# Patient Record
Sex: Female | Born: 1980 | Hispanic: No | Smoking: Never smoker
Health system: Southern US, Community
[De-identification: ages and names within clinical notes are randomized; demographics above are authoritative.]

## PROBLEM LIST (undated history)

## (undated) DIAGNOSIS — Z87442 Personal history of urinary calculi: Secondary | ICD-10-CM

## (undated) DIAGNOSIS — J302 Other seasonal allergic rhinitis: Secondary | ICD-10-CM

## (undated) DIAGNOSIS — N200 Calculus of kidney: Secondary | ICD-10-CM

## (undated) DIAGNOSIS — N83209 Unspecified ovarian cyst, unspecified side: Secondary | ICD-10-CM

## (undated) DIAGNOSIS — K219 Gastro-esophageal reflux disease without esophagitis: Secondary | ICD-10-CM

## (undated) HISTORY — PX: OVARIAN CYST REMOVAL: SHX89

## (undated) HISTORY — PX: KIDNEY STONE SURGERY: SHX686

## (undated) HISTORY — DX: Unspecified ovarian cyst, unspecified side: N83.209

## (undated) HISTORY — PX: TONSILLECTOMY: SUR1361

## (undated) HISTORY — PX: LASIK: SHX215

## (undated) HISTORY — PX: FOOT SURGERY: SHX648

## (undated) HISTORY — DX: Calculus of kidney: N20.0

---

## 2013-07-14 ENCOUNTER — Encounter: Payer: Self-pay | Admitting: Obstetrics and Gynecology

## 2013-07-14 ENCOUNTER — Ambulatory Visit (INDEPENDENT_AMBULATORY_CARE_PROVIDER_SITE_OTHER): Payer: 59 | Admitting: Obstetrics and Gynecology

## 2013-07-14 VITALS — BP 115/85 | HR 63 | Ht 66.25 in | Wt 150.2 lb

## 2013-07-14 DIAGNOSIS — Z309 Encounter for contraceptive management, unspecified: Secondary | ICD-10-CM

## 2013-07-14 DIAGNOSIS — Z01419 Encounter for gynecological examination (general) (routine) without abnormal findings: Secondary | ICD-10-CM

## 2013-07-14 DIAGNOSIS — Z3049 Encounter for surveillance of other contraceptives: Secondary | ICD-10-CM

## 2013-07-14 LAB — POCT PREGNANCY, URINE: Preg Test, Ur: NEGATIVE

## 2013-07-14 MED ORDER — ETONOGESTREL-ETHINYL ESTRADIOL 0.12-0.015 MG/24HR VA RING: VAGINAL_RING | VAGINAL | Status: DC

## 2013-07-14 NOTE — Progress Notes (Signed)
CC: Gynecologic Exam      HPI Julia Wolfe is a 32 y.o. G0P0000  who presents for GYN exam and Nuva Ring refill. She has been using Nuvaring and is happy with that method. Same-sex partner x2 years. No irritative vaginitis. No dysmenorrhea or dyspareunia. Menses regular. All Paps neg in the past. LMP 8/1//2014. Denies abnormal bleeding. Exercises twice a week and he is healthy diet. Wears seatbelt and uses sunscreen. 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    PMH: kidney stone, ovarian cyst FH: MGM colon cancer; 2 mat. Aunts breast Ca.   Past Surgical History  Procedure Laterality Date  . Tonsillectomy    . Ovarian cyst removal    . Kidney stone surgery    . Foot surgery    . Lasik      History   Social History  . Marital Status: Single    Spouse Name: N/A    Number of Children: N/A  . Years of Education: N/A   Occupational History  . Not on file.   Social History Main Topics  . Smoking status: Never Smoker   . Smokeless tobacco: Never Used  . Alcohol Use: Yes     Comment: rare  . Drug Use: No  . Sexual Activity: Yes     Comment: nuva ring   Other Topics Concern  . Not on file   Social History Narrative  . No narrative on file    No current outpatient prescriptions on file prior to visit.   No current facility-administered medications on file prior to visit.    No Known Allergies  ROS Pertinent items in HPI  PHYSICAL EXAM Filed Vitals:   07/14/13 1523  BP: 115/85  Pulse: 63   General: Well nourished, well developed female in no acute distress Cardiovascular: Normal rate Respiratory: Normal effort Neck: Thyroid NP Breasts: Symmetric, nipples erect without discharge, no discrete masses. No lymphadenopathy Abdomen: Soft, nontender Back: No CVAT Extremities: No edema Neurologic: Alert and oriented Speculum exam: NEFG; vagina with physiologic discharge, no blood; cervix clean Bimanual exam: cervix closed, no CMT; uterus NSSP; no adnexal  tenderness or masses   LAB RESULTS Results for orders placed in visit on 07/14/13 (from the past 24 hour(s))  POCT PREGNANCY, URINE     Status: None   Collection Time    07/14/13  3:19 PM      Result Value Range   Preg Test, Ur NEGATIVE  NEGATIVE    ASSESSMENT  1. Surveillance of other previously prescribed contraceptive method   Normal GYN exam  PLAN Pap with GC Chlamydia done. AVS Preventive health    Medication List       This list is accurate as of: 07/14/13  3:59 PM.  Always use your most recent med list.               etonogestrel-ethinyl estradiol 0.12-0.015 MG/24HR vaginal ring  Commonly known as:  NUVARING  Insert vaginally and leave in place for 3 consecutive weeks, then remove for 1 week.      We'll get ROI of records of previous Pap smears and followup as needed or yearly.    Danae Orleans, CNM 07/14/2013 3:54 PM

## 2013-07-14 NOTE — Addendum Note (Signed)
Addended by: Goldie Tregoning L on: 07/14/2013 04:28 PM   Modules accepted: Orders  

## 2013-07-14 NOTE — Patient Instructions (Addendum)

## 2013-07-14 NOTE — Progress Notes (Signed)
Needs rx for nuva ring for 90 day supply with 1 year of refills sent to mail order pharmacy that is on chart. Also needs a rx for 30 day supply printed for her to take to a pharmacy.

## 2013-10-10 ENCOUNTER — Other Ambulatory Visit: Payer: Self-pay | Admitting: Obstetrics and Gynecology

## 2013-10-29 ENCOUNTER — Other Ambulatory Visit: Payer: Self-pay

## 2013-10-29 DIAGNOSIS — Z3049 Encounter for surveillance of other contraceptives: Secondary | ICD-10-CM

## 2013-10-29 DIAGNOSIS — Z309 Encounter for contraceptive management, unspecified: Secondary | ICD-10-CM

## 2013-10-29 MED ORDER — ETONOGESTREL-ETHINYL ESTRADIOL 0.12-0.015 MG/24HR VA RING: VAGINAL_RING | VAGINAL | Status: DC

## 2013-10-29 NOTE — Telephone Encounter (Signed)
Pt's pharmacy sent via fax a refill on Nuva Ring.  Per [id number], pt can have refill on medication with pt having a normal pap smear resulted from 06/2013

## 2014-06-26 ENCOUNTER — Encounter: Payer: Self-pay | Admitting: Obstetrics and Gynecology

## 2014-06-26 ENCOUNTER — Ambulatory Visit (INDEPENDENT_AMBULATORY_CARE_PROVIDER_SITE_OTHER): Payer: 59 | Admitting: Obstetrics and Gynecology

## 2014-06-26 VITALS — BP 116/76 | HR 60 | Wt 142.0 lb

## 2014-06-26 DIAGNOSIS — Z3401 Encounter for supervision of normal first pregnancy, first trimester: Secondary | ICD-10-CM

## 2014-06-26 DIAGNOSIS — Z3400 Encounter for supervision of normal first pregnancy, unspecified trimester: Secondary | ICD-10-CM

## 2014-06-26 LAB — OB RESULTS CONSOLE RUBELLA ANTIBODY, IGM: Rubella: IMMUNE

## 2014-06-26 MED ORDER — DOXYLAMINE-PYRIDOXINE 10-10 MG PO TBEC: DELAYED_RELEASE_TABLET | ORAL | Status: DC

## 2014-06-26 MED ORDER — COMPLETENATE 29-1 MG PO CHEW: 1.0000 | CHEWABLE_TABLET | Freq: Every day | ORAL | Status: DC

## 2014-06-26 NOTE — Progress Notes (Signed)
   Subjective:    Julia Wolfe is a G1P0000 5237w1d being seen today for her first obstetrical visit.  Her obstetrical history is significant for first pregnancy. Patient does intend to breast feed. Pregnancy history fully reviewed.  Patient reports nausea.  Filed Vitals:   06/26/14 0945  BP: 116/76  Pulse: 60  Weight: 142 lb (64.411 kg)    HISTORY: OB History  Gravida Para Term Preterm AB SAB TAB Ectopic Multiple Living  1 0 0 0 0 0 0 0 0 0     # Outcome Date GA Lbr Len/2nd Weight Sex Delivery Anes PTL Lv  1 CUR              Past Medical History  Diagnosis Date  . Ovarian cyst   . Kidney stone    Past Surgical History  Procedure Laterality Date  . Tonsillectomy    . Ovarian cyst removal    . Kidney stone surgery    . Foot surgery    . Lasik    . Foot surgery Left 2008-2009   Family History  Problem Relation Age of Onset  . Cancer Maternal Aunt     breast  . Cancer Maternal Grandmother     colon  . Cancer Maternal Grandfather     lung  . Cancer Paternal Grandmother     bladder     Exam    Uterus:     Pelvic Exam:    Perineum: No Hemorrhoids, Normal Perineum   Vulva: normal   Vagina:  normal mucosa, normal discharge   pH:    Cervix: closed and long   Adnexa: normal adnexa and no mass, fullness, tenderness   Bony Pelvis: android  System: Breast:  normal appearance, no masses or tenderness   Skin: normal coloration and turgor, no rashes    Neurologic: oriented, no focal deficits   Extremities: normal strength, tone, and muscle mass   HEENT extra ocular movement intact   Mouth/Teeth mucous membranes moist, pharynx normal without lesions and dental hygiene good   Neck supple and no masses   Cardiovascular: regular rate and rhythm   Respiratory:  chest clear, no wheezing, crepitations, rhonchi, normal symmetric air entry   Abdomen: soft, non-tender; bowel sounds normal; no masses,  no organomegaly   Urinary:       Assessment:    Pregnancy:  G1P0000 Patient Active Problem List   Diagnosis Date Noted  . Supervision of normal first pregnancy 06/26/2014  . Contraception management 07/14/2013        Plan:     Initial labs drawn. Prenatal vitamins. Problem list reviewed and updated. Genetic Screening discussed First Screen: requested.  Ultrasound discussed; fetal survey: requested. Office ultrasound demonstrates an intrauterine gestational sac with yolk sac. No embryo visulaized. GS measuring 575w5d  Follow up in 4 weeks. 50% of 30 min visit spent on counseling and coordination of care.     Noal Abshier 06/26/2014

## 2014-06-26 NOTE — Patient Instructions (Signed)
First Trimester of Pregnancy The first trimester of pregnancy is from week 1 until the end of week 12 (months 1 through 3). A week after a sperm fertilizes an egg, the egg will implant on the wall of the uterus. This embryo will begin to develop into a baby. Genes from you and your partner are forming the baby. The female genes determine whether the baby is a boy or a girl. At 6-8 weeks, the eyes and face are formed, and the heartbeat can be seen on ultrasound. At the end of 12 weeks, all the baby's organs are formed.  Now that you are pregnant, you will want to do everything you can to have a healthy baby. Two of the most important things are to get good prenatal care and to follow your health care provider's instructions. Prenatal care is all the medical care you receive before the baby's birth. This care will help prevent, find, and treat any problems during the pregnancy and childbirth. BODY CHANGES Your body goes through many changes during pregnancy. The changes vary from woman to woman.   You may gain or lose a couple of pounds at first.  You may feel sick to your stomach (nauseous) and throw up (vomit). If the vomiting is uncontrollable, call your health care provider.  You may tire easily.  You may develop headaches that can be relieved by medicines approved by your health care provider.  You may urinate more often. Painful urination may mean you have a bladder infection.  You may develop heartburn as a result of your pregnancy.  You may develop constipation because certain hormones are causing the muscles that push waste through your intestines to slow down.  You may develop hemorrhoids or swollen, bulging veins (varicose veins).  Your breasts may begin to grow larger and become tender. Your nipples may stick out more, and the tissue that surrounds them (areola) may become darker.  Your gums may bleed and may be sensitive to brushing and flossing.  Dark spots or blotches  (chloasma, mask of pregnancy) may develop on your face. This will likely fade after the baby is born.  Your menstrual periods will stop.  You may have a loss of appetite.  You may develop cravings for certain kinds of food.  You may have changes in your emotions from day to day, such as being excited to be pregnant or being concerned that something may go wrong with the pregnancy and baby.  You may have more vivid and strange dreams.  You may have changes in your hair. These can include thickening of your hair, rapid growth, and changes in texture. Some women also have hair loss during or after pregnancy, or hair that feels dry or thin. Your hair will most likely return to normal after your baby is born. WHAT TO EXPECT AT YOUR PRENATAL VISITS During a routine prenatal visit:  You will be weighed to make sure you and the baby are growing normally.  Your blood pressure will be taken.  Your abdomen will be measured to track your baby's growth.  The fetal heartbeat will be listened to starting around week 10 or 12 of your pregnancy.  Test results from any previous visits will be discussed. Your health care provider may ask you:  How you are feeling.  If you are feeling the baby move.  If you have had any abnormal symptoms, such as leaking fluid, bleeding, severe headaches, or abdominal cramping.  If you have any questions. Other tests   that may be performed during your first trimester include:  Blood tests to find your blood type and to check for the presence of any previous infections. They will also be used to check for low iron levels (anemia) and Rh antibodies. Later in the pregnancy, blood tests for diabetes will be done along with other tests if problems develop.  Urine tests to check for infections, diabetes, or protein in the urine.  An ultrasound to confirm the proper growth and development of the baby.  An amniocentesis to check for possible genetic problems.  Fetal  screens for spina bifida and Down syndrome.  You may need other tests to make sure you and the baby are doing well. HOME CARE INSTRUCTIONS  Medicines  Follow your health care provider's instructions regarding medicine use. Specific medicines may be either safe or unsafe to take during pregnancy.  Take your prenatal vitamins as directed.  If you develop constipation, try taking a stool softener if your health care provider approves. Diet  Eat regular, well-balanced meals. Choose a variety of foods, such as meat or vegetable-based protein, fish, milk and low-fat dairy products, vegetables, fruits, and whole grain breads and cereals. Your health care provider will help you determine the amount of weight gain that is right for you.  Avoid raw meat and uncooked cheese. These carry germs that can cause birth defects in the baby.  Eating four or five small meals rather than three large meals a day may help relieve nausea and vomiting. If you start to feel nauseous, eating a few soda crackers can be helpful. Drinking liquids between meals instead of during meals also seems to help nausea and vomiting.  If you develop constipation, eat more high-fiber foods, such as fresh vegetables or fruit and whole grains. Drink enough fluids to keep your urine clear or pale yellow. Activity and Exercise  Exercise only as directed by your health care provider. Exercising will help you:  Control your weight.  Stay in shape.  Be prepared for labor and delivery.  Experiencing pain or cramping in the lower abdomen or low back is a good sign that you should stop exercising. Check with your health care provider before continuing normal exercises.  Try to avoid standing for long periods of time. Move your legs often if you must stand in one place for a long time.  Avoid heavy lifting.  Wear low-heeled shoes, and practice good posture.  You may continue to have sex unless your health care provider directs you  otherwise. Relief of Pain or Discomfort  Wear a good support bra for breast tenderness.   Take warm sitz baths to soothe any pain or discomfort caused by hemorrhoids. Use hemorrhoid cream if your health care provider approves.   Rest with your legs elevated if you have leg cramps or low back pain.  If you develop varicose veins in your legs, wear support hose. Elevate your feet for 15 minutes, 3-4 times a day. Limit salt in your diet. Prenatal Care  Schedule your prenatal visits by the twelfth week of pregnancy. They are usually scheduled monthly at first, then more often in the last 2 months before delivery.  Write down your questions. Take them to your prenatal visits.  Keep all your prenatal visits as directed by your health care provider. Safety  Wear your seat belt at all times when driving.  Make a list of emergency phone numbers, including numbers for family, friends, the hospital, and police and fire departments. General Tips    Ask your health care provider for a referral to a local prenatal education class. Begin classes no later than at the beginning of month 6 of your pregnancy.  Ask for help if you have counseling or nutritional needs during pregnancy. Your health care provider can offer advice or refer you to specialists for help with various needs.  Do not use hot tubs, steam rooms, or saunas.  Do not douche or use tampons or scented sanitary pads.  Do not cross your legs for long periods of time.  Avoid cat litter boxes and soil used by cats. These carry germs that can cause birth defects in the baby and possibly loss of the fetus by miscarriage or stillbirth.  Avoid all smoking, herbs, alcohol, and medicines not prescribed by your health care provider. Chemicals in these affect the formation and growth of the baby.  Schedule a dentist appointment. At home, brush your teeth with a soft toothbrush and be gentle when you floss. SEEK MEDICAL CARE IF:   You have  dizziness.  You have mild pelvic cramps, pelvic pressure, or nagging pain in the abdominal area.  You have persistent nausea, vomiting, or diarrhea.  You have a bad smelling vaginal discharge.  You have pain with urination.  You notice increased swelling in your face, hands, legs, or ankles. SEEK IMMEDIATE MEDICAL CARE IF:   You have a fever.  You are leaking fluid from your vagina.  You have spotting or bleeding from your vagina.  You have severe abdominal cramping or pain.  You have rapid weight gain or loss.  You vomit blood or material that looks like coffee grounds.  You are exposed to German measles and have never had them.  You are exposed to fifth disease or chickenpox.  You develop a severe headache.  You have shortness of breath.  You have any kind of trauma, such as from a fall or a car accident. Document Released: 10/31/2001 Document Revised: 03/23/2014 Document Reviewed: 09/16/2013 [employer] Patient Information 2015 [employer]. This information is not intended to replace advice given to you by your health care provider. Make sure you discuss any questions you have with your health care provider.  Contraception Choices Contraception (birth control) is the use of any methods or devices to prevent pregnancy. Below are some methods to help avoid pregnancy. HORMONAL METHODS   Contraceptive implant. This is a thin, plastic tube containing progesterone hormone. It does not contain estrogen hormone. Your health care provider inserts the tube in the inner part of the upper arm. The tube can remain in place for up to 3 years. After 3 years, the implant must be removed. The implant prevents the ovaries from releasing an egg (ovulation), thickens the cervical mucus to prevent sperm from entering the uterus, and thins the lining of the inside of the uterus.  Progesterone-only injections. These injections are given every 3 months by your health care provider to prevent  pregnancy. This synthetic progesterone hormone stops the ovaries from releasing eggs. It also thickens cervical mucus and changes the uterine lining. This makes it harder for sperm to survive in the uterus.  Birth control pills. These pills contain estrogen and progesterone hormone. They work by preventing the ovaries from releasing eggs (ovulation). They also cause the cervical mucus to thicken, preventing the sperm from entering the uterus. Birth control pills are prescribed by a health care provider.Birth control pills can also be used to treat heavy periods.  Minipill. This type of birth control pill contains   only the progesterone hormone. They are taken every day of each month and must be prescribed by your health care provider.  Birth control patch. The patch contains hormones similar to those in birth control pills. It must be changed once a week and is prescribed by a health care provider.  Vaginal ring. The ring contains hormones similar to those in birth control pills. It is left in the vagina for 3 weeks, removed for 1 week, and then a new one is put back in place. The patient must be comfortable inserting and removing the ring from the vagina.A health care provider's prescription is necessary.  Emergency contraception. Emergency contraceptives prevent pregnancy after unprotected sexual intercourse. This pill can be taken right after sex or up to 5 days after unprotected sex. It is most effective the sooner you take the pills after having sexual intercourse. Most emergency contraceptive pills are available without a prescription. Check with your pharmacist. Do not use emergency contraception as your only form of birth control. BARRIER METHODS   Female condom. This is a thin sheath (latex or rubber) that is worn over the penis during sexual intercourse. It can be used with spermicide to increase effectiveness.  Female condom. This is a soft, loose-fitting sheath that is put into the vagina  before sexual intercourse.  Diaphragm. This is a soft, latex, dome-shaped barrier that must be fitted by a health care provider. It is inserted into the vagina, along with a spermicidal jelly. It is inserted before intercourse. The diaphragm should be left in the vagina for 6 to 8 hours after intercourse.  Cervical cap. This is a round, soft, latex or plastic cup that fits over the cervix and must be fitted by a health care provider. The cap can be left in place for up to 48 hours after intercourse.  Sponge. This is a soft, circular piece of polyurethane foam. The sponge has spermicide in it. It is inserted into the vagina after wetting it and before sexual intercourse.  Spermicides. These are chemicals that kill or block sperm from entering the cervix and uterus. They come in the form of creams, jellies, suppositories, foam, or tablets. They do not require a prescription. They are inserted into the vagina with an applicator before having sexual intercourse. The process must be repeated every time you have sexual intercourse. INTRAUTERINE CONTRACEPTION  Intrauterine device (IUD). This is a T-shaped device that is put in a woman's uterus during a menstrual period to prevent pregnancy. There are 2 types:  Copper IUD. This type of IUD is wrapped in copper wire and is placed inside the uterus. Copper makes the uterus and fallopian tubes produce a fluid that kills sperm. It can stay in place for 10 years.  Hormone IUD. This type of IUD contains the hormone progestin (synthetic progesterone). The hormone thickens the cervical mucus and prevents sperm from entering the uterus, and it also thins the uterine lining to prevent implantation of a fertilized egg. The hormone can weaken or kill the sperm that get into the uterus. It can stay in place for 3-5 years, depending on which type of IUD is used. PERMANENT METHODS OF CONTRACEPTION  Female tubal ligation. This is when the woman's fallopian tubes are  surgically sealed, tied, or blocked to prevent the egg from traveling to the uterus.  Hysteroscopic sterilization. This involves placing a small coil or insert into each fallopian tube. Your doctor uses a technique called hysteroscopy to do the procedure. The device causes scar tissue   to form. This results in permanent blockage of the fallopian tubes, so the sperm cannot fertilize the egg. It takes about 3 months after the procedure for the tubes to become blocked. You must use another form of birth control for these 3 months.  Female sterilization. This is when the female has the tubes that carry sperm tied off (vasectomy).This blocks sperm from entering the vagina during sexual intercourse. After the procedure, the man can still ejaculate fluid (semen). NATURAL PLANNING METHODS  Natural family planning. This is not having sexual intercourse or using a barrier method (condom, diaphragm, cervical cap) on days the woman could become pregnant.  Calendar method. This is keeping track of the length of each menstrual cycle and identifying when you are fertile.  Ovulation method. This is avoiding sexual intercourse during ovulation.  Symptothermal method. This is avoiding sexual intercourse during ovulation, using a thermometer and ovulation symptoms.  Post-ovulation method. This is timing sexual intercourse after you have ovulated. Regardless of which type or method of contraception you choose, it is important that you use condoms to protect against the transmission of sexually transmitted infections (STIs). Talk with your health care provider about which form of contraception is most appropriate for you. Document Released: 11/06/2005 Document Revised: 11/11/2013 Document Reviewed: 05/01/2013 [employer] Patient Information 2015 [employer]. This information is not intended to replace advice given to you by your health care provider. Make sure you discuss any questions you have with your health care  provider.  Breastfeeding Deciding to breastfeed is one of the best choices you can make for you and your baby. A change in hormones during pregnancy causes your breast tissue to grow and increases the number and size of your milk ducts. These hormones also allow proteins, sugars, and fats from your blood supply to make breast milk in your milk-producing glands. Hormones prevent breast milk from being released before your baby is born as well as prompt milk flow after birth. Once breastfeeding has begun, thoughts of your baby, as well as his or her sucking or crying, can stimulate the release of milk from your milk-producing glands.  BENEFITS OF BREASTFEEDING For Your Baby  Your first milk (colostrum) helps your baby's digestive system function better.   There are antibodies in your milk that help your baby fight off infections.   Your baby has a lower incidence of asthma, allergies, and sudden infant death syndrome.   The nutrients in breast milk are better for your baby than infant formulas and are designed uniquely for your baby's needs.   Breast milk improves your baby's brain development.   Your baby is less likely to develop other conditions, such as childhood obesity, asthma, or type 2 diabetes mellitus.  For You   Breastfeeding helps to create a very special bond between you and your baby.   Breastfeeding is convenient. Breast milk is always available at the correct temperature and costs nothing.   Breastfeeding helps to burn calories and helps you lose the weight gained during pregnancy.   Breastfeeding makes your uterus contract to its prepregnancy size faster and slows bleeding (lochia) after you give birth.   Breastfeeding helps to lower your risk of developing type 2 diabetes mellitus, osteoporosis, and breast or ovarian cancer later in life. SIGNS THAT YOUR BABY IS HUNGRY Early Signs of Hunger  Increased alertness or activity.  Stretching.  Movement of the  head from side to side.  Movement of the head and opening of the mouth when the corner   of the mouth or cheek is stroked (rooting).  Increased sucking sounds, smacking lips, cooing, sighing, or squeaking.  Hand-to-mouth movements.  Increased sucking of fingers or hands. Late Signs of Hunger  Fussing.  Intermittent crying. Extreme Signs of Hunger Signs of extreme hunger will require calming and consoling before your baby will be able to breastfeed successfully. Do not wait for the following signs of extreme hunger to occur before you initiate breastfeeding:   Restlessness.  A loud, strong cry.   Screaming. BREASTFEEDING BASICS Breastfeeding Initiation  Find a comfortable place to sit or lie down, with your neck and back well supported.  Place a pillow or rolled up blanket under your baby to bring him or her to the level of your breast (if you are seated). Nursing pillows are specially designed to help support your arms and your baby while you breastfeed.  Make sure that your baby's abdomen is facing your abdomen.   Gently massage your breast. With your fingertips, massage from your chest wall toward your nipple in a circular motion. This encourages milk flow. You may need to continue this action during the feeding if your milk flows slowly.  Support your breast with 4 fingers underneath and your thumb above your nipple. Make sure your fingers are well away from your nipple and your baby's mouth.   Stroke your baby's lips gently with your finger or nipple.   When your baby's mouth is open wide enough, quickly bring your baby to your breast, placing your entire nipple and as much of the colored area around your nipple (areola) as possible into your baby's mouth.   More areola should be visible above your baby's upper lip than below the lower lip.   Your baby's tongue should be between his or her lower gum and your breast.   Ensure that your baby's mouth is correctly  positioned around your nipple (latched). Your baby's lips should create a seal on your breast and be turned out (everted).  It is common for your baby to suck about 2-3 minutes in order to start the flow of breast milk. Latching Teaching your baby how to latch on to your breast properly is very important. An improper latch can cause nipple pain and decreased milk supply for you and poor weight gain in your baby. Also, if your baby is not latched onto your nipple properly, he or she may swallow some air during feeding. This can make your baby fussy. Burping your baby when you switch breasts during the feeding can help to get rid of the air. However, teaching your baby to latch on properly is still the best way to prevent fussiness from swallowing air while breastfeeding. Signs that your baby has successfully latched on to your nipple:    Silent tugging or silent sucking, without causing you pain.   Swallowing heard between every 3-4 sucks.    Muscle movement above and in front of his or her ears while sucking.  Signs that your baby has not successfully latched on to nipple:   Sucking sounds or smacking sounds from your baby while breastfeeding.  Nipple pain. If you think your baby has not latched on correctly, slip your finger into the corner of your baby's mouth to break the suction and place it between your baby's gums. Attempt breastfeeding initiation again. Signs of Successful Breastfeeding Signs from your baby:   A gradual decrease in the number of sucks or complete cessation of sucking.   Falling asleep.     Relaxation of his or her body.   Retention of a small amount of milk in his or her mouth.   Letting go of your breast by himself or herself. Signs from you:  Breasts that have increased in firmness, weight, and size 1-3 hours after feeding.   Breasts that are softer immediately after breastfeeding.  Increased milk volume, as well as a change in milk consistency  and color by the fifth day of breastfeeding.   Nipples that are not sore, cracked, or bleeding. Signs That Your Baby is Getting Enough Milk  Wetting at least 3 diapers in a 24-hour period. The urine should be clear and pale yellow by age 5 days.  At least 3 stools in a 24-hour period by age 5 days. The stool should be soft and yellow.  At least 3 stools in a 24-hour period by age 7 days. The stool should be seedy and yellow.  No loss of weight greater than 10% of birth weight during the first 3 days of age.  Average weight gain of 4-7 ounces (113-198 g) per week after age 4 days.  Consistent daily weight gain by age 5 days, without weight loss after the age of 2 weeks. After a feeding, your baby may spit up a small amount. This is common. BREASTFEEDING FREQUENCY AND DURATION Frequent feeding will help you make more milk and can prevent sore nipples and breast engorgement. Breastfeed when you feel the need to reduce the fullness of your breasts or when your baby shows signs of hunger. This is called "breastfeeding on demand." Avoid introducing a pacifier to your baby while you are working to establish breastfeeding (the first 4-6 weeks after your baby is born). After this time you may choose to use a pacifier. Research has shown that pacifier use during the first year of a baby's life decreases the risk of sudden infant death syndrome (SIDS). Allow your baby to feed on each breast as long as he or she wants. Breastfeed until your baby is finished feeding. When your baby unlatches or falls asleep while feeding from the first breast, offer the second breast. Because newborns are often sleepy in the first few weeks of life, you may need to awaken your baby to get him or her to feed. Breastfeeding times will vary from baby to baby. However, the following rules can serve as a guide to help you ensure that your baby is properly fed:  Newborns (babies 4 weeks of age or younger) may breastfeed every  1-3 hours.  Newborns should not go longer than 3 hours during the day or 5 hours during the night without breastfeeding.  You should breastfeed your baby a minimum of 8 times in a 24-hour period until you begin to introduce solid foods to your baby at around 6 months of age. BREAST MILK PUMPING Pumping and storing breast milk allows you to ensure that your baby is exclusively fed your breast milk, even at times when you are unable to breastfeed. This is especially important if you are going back to work while you are still breastfeeding or when you are not able to be present during feedings. Your lactation consultant can give you guidelines on how long it is safe to store breast milk.  A breast pump is a machine that allows you to pump milk from your breast into a sterile bottle. The pumped breast milk can then be stored in a refrigerator or freezer. Some breast pumps are operated by hand, while others   use electricity. Ask your lactation consultant which type will work best for you. Breast pumps can be purchased, but some hospitals and breastfeeding support groups lease breast pumps on a monthly basis. A lactation consultant can teach you how to hand express breast milk, if you prefer not to use a pump.  CARING FOR YOUR BREASTS WHILE YOU BREASTFEED Nipples can become dry, cracked, and sore while breastfeeding. The following recommendations can help keep your breasts moisturized and healthy:  Avoid using soap on your nipples.   Wear a supportive bra. Although not required, special nursing bras and tank tops are designed to allow access to your breasts for breastfeeding without taking off your entire bra or top. Avoid wearing underwire-style bras or extremely tight bras.  Air dry your nipples for 3-4minutes after each feeding.   Use only cotton bra pads to absorb leaked breast milk. Leaking of breast milk between feedings is normal.   Use lanolin on your nipples after breastfeeding. Lanolin  helps to maintain your skin's normal moisture barrier. If you use pure lanolin, you do not need to wash it off before feeding your baby again. Pure lanolin is not toxic to your baby. You may also hand express a few drops of breast milk and gently massage that milk into your nipples and allow the milk to air dry. In the first few weeks after giving birth, some women experience extremely full breasts (engorgement). Engorgement can make your breasts feel heavy, warm, and tender to the touch. Engorgement peaks within 3-5 days after you give birth. The following recommendations can help ease engorgement:  Completely empty your breasts while breastfeeding or pumping. You may want to start by applying warm, moist heat (in the shower or with warm water-soaked hand towels) just before feeding or pumping. This increases circulation and helps the milk flow. If your baby does not completely empty your breasts while breastfeeding, pump any extra milk after he or she is finished.  Wear a snug bra (nursing or regular) or tank top for 1-2 days to signal your body to slightly decrease milk production.  Apply ice packs to your breasts, unless this is too uncomfortable for you.  Make sure that your baby is latched on and positioned properly while breastfeeding. If engorgement persists after 48 hours of following these recommendations, contact your health care provider or a lactation consultant. OVERALL HEALTH CARE RECOMMENDATIONS WHILE BREASTFEEDING  Eat healthy foods. Alternate between meals and snacks, eating 3 of each per day. Because what you eat affects your breast milk, some of the foods may make your baby more irritable than usual. Avoid eating these foods if you are sure that they are negatively affecting your baby.  Drink milk, fruit juice, and water to satisfy your thirst (about 10 glasses a day).   Rest often, relax, and continue to take your prenatal vitamins to prevent fatigue, stress, and  anemia.  Continue breast self-awareness checks.  Avoid chewing and smoking tobacco.  Avoid alcohol and drug use. Some medicines that may be harmful to your baby can pass through breast milk. It is important to ask your health care provider before taking any medicine, including all over-the-counter and prescription medicine as well as vitamin and herbal supplements. It is possible to become pregnant while breastfeeding. If birth control is desired, ask your health care provider about options that will be safe for your baby. SEEK MEDICAL CARE IF:   You feel like you want to stop breastfeeding or have become frustrated with breastfeeding.    You have painful breasts or nipples.  Your nipples are cracked or bleeding.  Your breasts are red, tender, or warm.  You have a swollen area on either breast.  You have a fever or chills.  You have nausea or vomiting.  You have drainage other than breast milk from your nipples.  Your breasts do not become full before feedings by the fifth day after you give birth.  You feel sad and depressed.  Your baby is too sleepy to eat well.  Your baby is having trouble sleeping.   Your baby is wetting less than 3 diapers in a 24-hour period.  Your baby has less than 3 stools in a 24-hour period.  Your baby's skin or the white part of his or her eyes becomes yellow.   Your baby is not gaining weight by 5 days of age. SEEK IMMEDIATE MEDICAL CARE IF:   Your baby is overly tired (lethargic) and does not want to wake up and feed.  Your baby develops an unexplained fever. Document Released: 11/06/2005 Document Revised: 11/11/2013 Document Reviewed: 04/30/2013 [employer] Patient Information 2015 [employer]. This information is not intended to replace advice given to you by your health care provider. Make sure you discuss any questions you have with your health care provider.  

## 2014-06-27 LAB — HIV ANTIBODY (ROUTINE TESTING W REFLEX): HIV 1/HIV 2 AB: NONREACTIVE

## 2014-06-27 LAB — CULTURE, OB URINE
Colony Count: NO GROWTH
ORGANISM ID, BACTERIA: NO GROWTH

## 2014-06-27 LAB — CBC
HEMATOCRIT: 41 % (ref 34.0–46.6)
Hemoglobin: 13.7 g/dL (ref 11.1–15.9)
MCH: 29.3 pg (ref 26.6–33.0)
MCHC: 33.4 g/dL (ref 31.5–35.7)
MCV: 88 fL (ref 79–97)
PLATELETS: 331 10*3/uL (ref 150–379)
RBC: 4.68 x10E6/uL (ref 3.77–5.28)
RDW: 14 % (ref 12.3–15.4)
WBC: 6.6 10*3/uL (ref 3.4–10.8)

## 2014-06-27 LAB — HEPATITIS B SURFACE ANTIGEN: Hepatitis B Surface Ag: NEGATIVE

## 2014-06-27 LAB — RPR: RPR: NONREACTIVE

## 2014-06-27 LAB — RUBELLA SCREEN: Rubella Antibodies, IGG: 13.2 index (ref >0.99)

## 2014-07-01 LAB — PAP, THINPREP RFLX HPV WITH CT/GC: PAP SMEAR COMMENT: 0

## 2014-07-24 ENCOUNTER — Encounter: Payer: Self-pay | Admitting: Obstetrics and Gynecology

## 2014-07-24 ENCOUNTER — Ambulatory Visit (INDEPENDENT_AMBULATORY_CARE_PROVIDER_SITE_OTHER): Payer: 59 | Admitting: Obstetrics and Gynecology

## 2014-07-24 VITALS — BP 113/66 | HR 61 | Wt 140.0 lb

## 2014-07-24 DIAGNOSIS — Z23 Encounter for immunization: Secondary | ICD-10-CM

## 2014-07-24 DIAGNOSIS — Z3401 Encounter for supervision of normal first pregnancy, first trimester: Secondary | ICD-10-CM

## 2014-07-24 DIAGNOSIS — Z3400 Encounter for supervision of normal first pregnancy, unspecified trimester: Secondary | ICD-10-CM

## 2014-07-24 NOTE — Addendum Note (Signed)
Addended by: Tandy Gaw C on: 07/24/2014 09:52 AM   Modules accepted: Orders

## 2014-07-24 NOTE — Progress Notes (Signed)
Patient is doing well without complaints. First trimester screen scheduled for 9/18.

## 2014-08-03 ENCOUNTER — Other Ambulatory Visit: Payer: Self-pay | Admitting: Obstetrics and Gynecology

## 2014-08-03 DIAGNOSIS — Z3682 Encounter for antenatal screening for nuchal translucency: Secondary | ICD-10-CM

## 2014-08-07 ENCOUNTER — Ambulatory Visit (HOSPITAL_COMMUNITY)
Admission: RE | Admit: 2014-08-07 | Discharge: 2014-08-07 | Disposition: A | Discharge status: Home / Self Care | Payer: 59 | Source: Ambulatory Visit | Attending: Obstetrics and Gynecology | Admitting: Obstetrics and Gynecology

## 2014-08-07 ENCOUNTER — Other Ambulatory Visit: Payer: Self-pay | Admitting: Obstetrics and Gynecology

## 2014-08-07 ENCOUNTER — Encounter (HOSPITAL_COMMUNITY): Payer: Self-pay

## 2014-08-07 ENCOUNTER — Ambulatory Visit (HOSPITAL_COMMUNITY): Admission: RE | Admit: 2014-08-07 | Payer: 59 | Source: Ambulatory Visit

## 2014-08-07 DIAGNOSIS — Z3682 Encounter for antenatal screening for nuchal translucency: Secondary | ICD-10-CM

## 2014-08-07 DIAGNOSIS — Z36 Encounter for antenatal screening of mother: Secondary | ICD-10-CM | POA: Insufficient documentation

## 2014-08-21 ENCOUNTER — Other Ambulatory Visit: Payer: Self-pay | Admitting: Obstetrics & Gynecology

## 2014-08-21 ENCOUNTER — Ambulatory Visit (HOSPITAL_COMMUNITY)
Admission: RE | Admit: 2014-08-21 | Discharge: 2014-08-21 | Disposition: A | Discharge status: Home / Self Care | Payer: 59 | Source: Ambulatory Visit | Attending: Obstetrics & Gynecology | Admitting: Obstetrics & Gynecology

## 2014-08-21 ENCOUNTER — Ambulatory Visit (INDEPENDENT_AMBULATORY_CARE_PROVIDER_SITE_OTHER): Payer: 59 | Admitting: Obstetrics & Gynecology

## 2014-08-21 ENCOUNTER — Encounter: Payer: Self-pay | Admitting: Obstetrics & Gynecology

## 2014-08-21 ENCOUNTER — Ambulatory Visit (HOSPITAL_COMMUNITY)
Admission: RE | Admit: 2014-08-21 | Discharge: 2014-08-21 | Disposition: A | Discharge status: Home / Self Care | Payer: 59 | Source: Ambulatory Visit | Attending: Obstetrics and Gynecology | Admitting: Obstetrics and Gynecology

## 2014-08-21 ENCOUNTER — Encounter (HOSPITAL_COMMUNITY): Payer: Self-pay

## 2014-08-21 VITALS — BP 111/65 | HR 66 | Wt 142.8 lb

## 2014-08-21 VITALS — BP 114/65 | HR 60 | Wt 142.4 lb

## 2014-08-21 DIAGNOSIS — Z36 Encounter for antenatal screening of mother: Secondary | ICD-10-CM | POA: Insufficient documentation

## 2014-08-21 DIAGNOSIS — Z3402 Encounter for supervision of normal first pregnancy, second trimester: Secondary | ICD-10-CM

## 2014-08-21 DIAGNOSIS — Z3682 Encounter for antenatal screening for nuchal translucency: Secondary | ICD-10-CM

## 2014-08-21 DIAGNOSIS — Z3A14 14 weeks gestation of pregnancy: Secondary | ICD-10-CM

## 2014-08-21 NOTE — Progress Notes (Signed)
Routine visit. No problems except some mild nausea. MSAFP at next visit.

## 2014-08-21 NOTE — Progress Notes (Signed)
Patient is having increased nausea in the last two weeks.  [place] is traveling to [place] 4 [place] for her grandmothers funeral service.  She is going today to follow up for her first trimester screening as they did not get all of the measurements needed at her first visit.

## 2014-08-28 ENCOUNTER — Other Ambulatory Visit: Payer: Self-pay

## 2014-09-18 ENCOUNTER — Ambulatory Visit (INDEPENDENT_AMBULATORY_CARE_PROVIDER_SITE_OTHER): Payer: 59 | Admitting: Obstetrics & Gynecology

## 2014-09-18 VITALS — BP 123/63 | HR 73 | Wt 139.0 lb

## 2014-09-18 DIAGNOSIS — Z3402 Encounter for supervision of normal first pregnancy, second trimester: Secondary | ICD-10-CM

## 2014-09-18 NOTE — Progress Notes (Signed)
Normal first screen, AFP only screen today.  Anatomy scan scheduled.  No other complaints or concerns.  Routine obstetric precautions reviewed.

## 2014-09-18 NOTE — Patient Instructions (Signed)
Return to clinic for any obstetric concerns or go to [facility] for evaluation Contraception Choices Contraception (birth control) is the use of any methods or devices to prevent pregnancy. Below are some methods to help avoid pregnancy. HORMONAL METHODS   Contraceptive implant. This is a thin, plastic tube containing progesterone hormone. It does not contain estrogen hormone. Your health care provider inserts the tube in the inner part of the upper arm. The tube can remain in place for up to 3 years. After 3 years, the implant must be removed. The implant prevents the ovaries from releasing an egg (ovulation), thickens the cervical mucus to prevent sperm from entering the uterus, and thins the lining of the inside of the uterus.  Progesterone-only injections. These injections are given every 3 months by your health care provider to prevent pregnancy. This synthetic progesterone hormone stops the ovaries from releasing eggs. It also thickens cervical mucus and changes the uterine lining. This makes it harder for sperm to survive in the uterus.  Birth control pills. These pills contain estrogen and progesterone hormone. They work by preventing the ovaries from releasing eggs (ovulation). They also cause the cervical mucus to thicken, preventing the sperm from entering the uterus. Birth control pills are prescribed by a health care provider.Birth control pills can also be used to treat heavy periods.  Minipill. This type of birth control pill contains only the progesterone hormone. They are taken every day of each month and must be prescribed by your health care provider.  Birth control patch. The patch contains hormones similar to those in birth control pills. It must be changed once a week and is prescribed by a health care provider.  Vaginal ring. The ring contains hormones similar to those in birth control pills. It is left in the vagina for 3 weeks, removed for 1 week, and then a new one is put back in  place. The patient must be comfortable inserting and removing the ring from the vagina.A health care provider's prescription is necessary.  Emergency contraception. Emergency contraceptives prevent pregnancy after unprotected sexual intercourse. This pill can be taken right after sex or up to 5 days after unprotected sex. It is most effective the sooner you take the pills after having sexual intercourse. Most emergency contraceptive pills are available without a prescription. Check with your pharmacist. Do not use emergency contraception as your only form of birth control. BARRIER METHODS   Female condom. This is a thin sheath (latex or rubber) that is worn over the penis during sexual intercourse. It can be used with spermicide to increase effectiveness.  Female condom. This is a soft, loose-fitting sheath that is put into the vagina before sexual intercourse.  Diaphragm. This is a soft, latex, dome-shaped barrier that must be fitted by a health care provider. It is inserted into the vagina, along with a spermicidal jelly. It is inserted before intercourse. The diaphragm should be left in the vagina for 6 to 8 hours after intercourse.  Cervical cap. This is a round, soft, latex or plastic cup that fits over the cervix and must be fitted by a health care provider. The cap can be left in place for up to 48 hours after intercourse.  Sponge. This is a soft, circular piece of polyurethane foam. The sponge has spermicide in it. It is inserted into the vagina after wetting it and before sexual intercourse.  Spermicides. These are chemicals that kill or block sperm from entering the cervix and uterus. They come in the   form of creams, jellies, suppositories, foam, or tablets. They do not require a prescription. They are inserted into the vagina with an applicator before having sexual intercourse. The process must be repeated every time you have sexual intercourse. INTRAUTERINE CONTRACEPTION  Intrauterine  device (IUD). This is a T-shaped device that is put in a woman's uterus during a menstrual period to prevent pregnancy. There are 2 types:  Copper IUD. This type of IUD is wrapped in copper wire and is placed inside the uterus. Copper makes the uterus and fallopian tubes produce a fluid that kills sperm. It can stay in place for 10 years.  Hormone IUD. This type of IUD contains the hormone progestin (synthetic progesterone). The hormone thickens the cervical mucus and prevents sperm from entering the uterus, and it also thins the uterine lining to prevent implantation of a fertilized egg. The hormone can weaken or kill the sperm that get into the uterus. It can stay in place for 3-5 years, depending on which type of IUD is used. PERMANENT METHODS OF CONTRACEPTION  Female tubal ligation. This is when the woman's fallopian tubes are surgically sealed, tied, or blocked to prevent the egg from traveling to the uterus.  Hysteroscopic sterilization. This involves placing a small coil or insert into each fallopian tube. Your doctor uses a technique called hysteroscopy to do the procedure. The device causes scar tissue to form. This results in permanent blockage of the fallopian tubes, so the sperm cannot fertilize the egg. It takes about 3 months after the procedure for the tubes to become blocked. You must use another form of birth control for these 3 months.  Female sterilization. This is when the female has the tubes that carry sperm tied off (vasectomy).This blocks sperm from entering the vagina during sexual intercourse. After the procedure, the man can still ejaculate fluid (semen). NATURAL PLANNING METHODS  Natural family planning. This is not having sexual intercourse or using a barrier method (condom, diaphragm, cervical cap) on days the woman could become pregnant.  Calendar method. This is keeping track of the length of each menstrual cycle and identifying when you are fertile.  Ovulation method.  This is avoiding sexual intercourse during ovulation.  Symptothermal method. This is avoiding sexual intercourse during ovulation, using a thermometer and ovulation symptoms.  Post-ovulation method. This is timing sexual intercourse after you have ovulated. Regardless of which type or method of contraception you choose, it is important that you use condoms to protect against the transmission of sexually transmitted infections (STIs). Talk with your health care provider about which form of contraception is most appropriate for you. Document Released: 11/06/2005 Document Revised: 11/11/2013 Document Reviewed: 05/01/2013 [employer] Patient Information 2015 [employer]. This information is not intended to replace advice given to you by your health care provider. Make sure you discuss any questions you have with your health care provider.  

## 2014-09-21 ENCOUNTER — Encounter (HOSPITAL_COMMUNITY): Payer: Self-pay

## 2014-09-21 LAB — ALPHA FETOPROTEIN, MATERNAL
AFP: 47.8 ng/mL
CURR GEST AGE: 18.1 wks.days
MoM for AFP: 1.05
Open Spina bifida: NEGATIVE
Osb Risk: 1:11400 {titer}

## 2014-09-25 ENCOUNTER — Ambulatory Visit (HOSPITAL_COMMUNITY)
Admission: RE | Admit: 2014-09-25 | Discharge: 2014-09-25 | Disposition: A | Discharge status: Home / Self Care | Payer: 59 | Source: Ambulatory Visit | Attending: Obstetrics & Gynecology | Admitting: Obstetrics & Gynecology

## 2014-09-25 DIAGNOSIS — Z3689 Encounter for other specified antenatal screening: Secondary | ICD-10-CM | POA: Insufficient documentation

## 2014-09-25 DIAGNOSIS — Z36 Encounter for antenatal screening of mother: Secondary | ICD-10-CM | POA: Insufficient documentation

## 2014-09-25 DIAGNOSIS — Z3A19 19 weeks gestation of pregnancy: Secondary | ICD-10-CM | POA: Diagnosis not present

## 2014-09-25 DIAGNOSIS — Z3402 Encounter for supervision of normal first pregnancy, second trimester: Secondary | ICD-10-CM

## 2014-10-06 ENCOUNTER — Other Ambulatory Visit: Payer: 59

## 2014-10-09 ENCOUNTER — Encounter: Payer: Self-pay | Admitting: Obstetrics and Gynecology

## 2014-10-09 ENCOUNTER — Ambulatory Visit (INDEPENDENT_AMBULATORY_CARE_PROVIDER_SITE_OTHER): Payer: 59 | Admitting: Obstetrics and Gynecology

## 2014-10-09 VITALS — BP 106/52 | HR 60 | Wt 143.0 lb

## 2014-10-09 DIAGNOSIS — Z3402 Encounter for supervision of normal first pregnancy, second trimester: Secondary | ICD-10-CM

## 2014-10-09 NOTE — Progress Notes (Signed)
Patient is doing well without complaints.

## 2014-11-06 ENCOUNTER — Encounter: Payer: 59 | Admitting: Obstetrics & Gynecology

## 2014-11-11 ENCOUNTER — Encounter: Payer: Self-pay | Admitting: Physician Assistant

## 2014-11-11 ENCOUNTER — Ambulatory Visit (INDEPENDENT_AMBULATORY_CARE_PROVIDER_SITE_OTHER): Payer: Medicaid Other | Admitting: Physician Assistant

## 2014-11-11 VITALS — BP 114/66 | HR 59 | Wt 148.0 lb

## 2014-11-11 DIAGNOSIS — Z3402 Encounter for supervision of normal first pregnancy, second trimester: Secondary | ICD-10-CM | POA: Diagnosis not present

## 2014-11-11 DIAGNOSIS — Z23 Encounter for immunization: Secondary | ICD-10-CM | POA: Diagnosis not present

## 2014-11-11 NOTE — Patient Instructions (Signed)
Breastfeeding Deciding to breastfeed is one of the best choices you can make for you and your baby. A change in hormones during pregnancy causes your breast tissue to grow and increases the number and size of your milk ducts. These hormones also allow proteins, sugars, and fats from your blood supply to make breast milk in your milk-producing glands. Hormones prevent breast milk from being released before your baby is born as well as prompt milk flow after birth. Once breastfeeding has begun, thoughts of your baby, as well as his or her sucking or crying, can stimulate the release of milk from your milk-producing glands.  BENEFITS OF BREASTFEEDING For Your Baby  Your first milk (colostrum) helps your baby's digestive system function better.   There are antibodies in your milk that help your baby fight off infections.   Your baby has a lower incidence of asthma, allergies, and sudden infant death syndrome.   The nutrients in breast milk are better for your baby than infant formulas and are designed uniquely for your baby's needs.   Breast milk improves your baby's brain development.   Your baby is less likely to develop other conditions, such as childhood obesity, asthma, or type 2 diabetes mellitus.  For You   Breastfeeding helps to create a very special bond between you and your baby.   Breastfeeding is convenient. Breast milk is always available at the correct temperature and costs nothing.   Breastfeeding helps to burn calories and helps you lose the weight gained during pregnancy.   Breastfeeding makes your uterus contract to its prepregnancy size faster and slows bleeding (lochia) after you give birth.   Breastfeeding helps to lower your risk of developing type 2 diabetes mellitus, osteoporosis, and breast or ovarian cancer later in life. SIGNS THAT YOUR BABY IS HUNGRY Early Signs of Hunger  Increased alertness or activity.  Stretching.  Movement of the head from  side to side.  Movement of the head and opening of the mouth when the corner of the mouth or cheek is stroked (rooting).  Increased sucking sounds, smacking lips, cooing, sighing, or squeaking.  Hand-to-mouth movements.  Increased sucking of fingers or hands. Late Signs of Hunger  Fussing.  Intermittent crying. Extreme Signs of Hunger Signs of extreme hunger will require calming and consoling before your baby will be able to breastfeed successfully. Do not wait for the following signs of extreme hunger to occur before you initiate breastfeeding:   Restlessness.  A loud, strong cry.   Screaming. BREASTFEEDING BASICS Breastfeeding Initiation  Find a comfortable place to sit or lie down, with your neck and back well supported.  Place a pillow or rolled up blanket under your baby to bring him or her to the level of your breast (if you are seated). Nursing pillows are specially designed to help support your arms and your baby while you breastfeed.  Make sure that your baby's abdomen is facing your abdomen.   Gently massage your breast. With your fingertips, massage from your chest wall toward your nipple in a circular motion. This encourages milk flow. You may need to continue this action during the feeding if your milk flows slowly.  Support your breast with 4 fingers underneath and your thumb above your nipple. Make sure your fingers are well away from your nipple and your baby's mouth.   Stroke your baby's lips gently with your finger or nipple.   When your baby's mouth is open wide enough, quickly bring your baby to your   breast, placing your entire nipple and as much of the colored area around your nipple (areola) as possible into your baby's mouth.   More areola should be visible above your baby's upper lip than below the lower lip.   Your baby's tongue should be between his or her lower gum and your breast.   Ensure that your baby's mouth is correctly positioned  around your nipple (latched). Your baby's lips should create a seal on your breast and be turned out (everted).  It is common for your baby to suck about 2-3 minutes in order to start the flow of breast milk. Latching Teaching your baby how to latch on to your breast properly is very important. An improper latch can cause nipple pain and decreased milk supply for you and poor weight gain in your baby. Also, if your baby is not latched onto your nipple properly, he or she may swallow some air during feeding. This can make your baby fussy. Burping your baby when you switch breasts during the feeding can help to get rid of the air. However, teaching your baby to latch on properly is still the best way to prevent fussiness from swallowing air while breastfeeding. Signs that your baby has successfully latched on to your nipple:    Silent tugging or silent sucking, without causing you pain.   Swallowing heard between every 3-4 sucks.    Muscle movement above and in front of his or her ears while sucking.  Signs that your baby has not successfully latched on to nipple:   Sucking sounds or smacking sounds from your baby while breastfeeding.  Nipple pain. If you think your baby has not latched on correctly, slip your finger into the corner of your baby's mouth to break the suction and place it between your baby's gums. Attempt breastfeeding initiation again. Signs of Successful Breastfeeding Signs from your baby:   A gradual decrease in the number of sucks or complete cessation of sucking.   Falling asleep.   Relaxation of his or her body.   Retention of a small amount of milk in his or her mouth.   Letting go of your breast by himself or herself. Signs from you:  Breasts that have increased in firmness, weight, and size 1-3 hours after feeding.   Breasts that are softer immediately after breastfeeding.  Increased milk volume, as well as a change in milk consistency and color by  the fifth day of breastfeeding.   Nipples that are not sore, cracked, or bleeding. Signs That Your Baby is Getting Enough Milk  Wetting at least 3 diapers in a 24-hour period. The urine should be clear and pale yellow by age 5 days.  At least 3 stools in a 24-hour period by age 5 days. The stool should be soft and yellow.  At least 3 stools in a 24-hour period by age 7 days. The stool should be seedy and yellow.  No loss of weight greater than 10% of birth weight during the first 3 days of age.  Average weight gain of 4-7 ounces (113-198 g) per week after age 4 days.  Consistent daily weight gain by age 5 days, without weight loss after the age of 2 weeks. After a feeding, your baby may spit up a small amount. This is common. BREASTFEEDING FREQUENCY AND DURATION Frequent feeding will help you make more milk and can prevent sore nipples and breast engorgement. Breastfeed when you feel the need to reduce the fullness of your breasts   or when your baby shows signs of hunger. This is called "breastfeeding on demand." Avoid introducing a pacifier to your baby while you are working to establish breastfeeding (the first 4-6 weeks after your baby is born). After this time you may choose to use a pacifier. Research has shown that pacifier use during the first year of a baby's life decreases the risk of sudden infant death syndrome (SIDS). Allow your baby to feed on each breast as long as he or she wants. Breastfeed until your baby is finished feeding. When your baby unlatches or falls asleep while feeding from the first breast, offer the second breast. Because newborns are often sleepy in the first few weeks of life, you may need to awaken your baby to get him or her to feed. Breastfeeding times will vary from baby to baby. However, the following rules can serve as a guide to help you ensure that your baby is properly fed:  Newborns (babies 4 weeks of age or younger) may breastfeed every 1-3  hours.  Newborns should not go longer than 3 hours during the day or 5 hours during the night without breastfeeding.  You should breastfeed your baby a minimum of 8 times in a 24-hour period until you begin to introduce solid foods to your baby at around 6 months of age. BREAST MILK PUMPING Pumping and storing breast milk allows you to ensure that your baby is exclusively fed your breast milk, even at times when you are unable to breastfeed. This is especially important if you are going back to work while you are still breastfeeding or when you are not able to be present during feedings. Your lactation consultant can give you guidelines on how long it is safe to store breast milk.  A breast pump is a machine that allows you to pump milk from your breast into a sterile bottle. The pumped breast milk can then be stored in a refrigerator or freezer. Some breast pumps are operated by hand, while others use electricity. Ask your lactation consultant which type will work best for you. Breast pumps can be purchased, but some hospitals and breastfeeding support groups lease breast pumps on a monthly basis. A lactation consultant can teach you how to hand express breast milk, if you prefer not to use a pump.  CARING FOR YOUR BREASTS WHILE YOU BREASTFEED Nipples can become dry, cracked, and sore while breastfeeding. The following recommendations can help keep your breasts moisturized and healthy:  Avoid using soap on your nipples.   Wear a supportive bra. Although not required, special nursing bras and tank tops are designed to allow access to your breasts for breastfeeding without taking off your entire bra or top. Avoid wearing underwire-style bras or extremely tight bras.  Air dry your nipples for 3-4minutes after each feeding.   Use only cotton bra pads to absorb leaked breast milk. Leaking of breast milk between feedings is normal.   Use lanolin on your nipples after breastfeeding. Lanolin helps to  maintain your skin's normal moisture barrier. If you use pure lanolin, you do not need to wash it off before feeding your baby again. Pure lanolin is not toxic to your baby. You may also hand express a few drops of breast milk and gently massage that milk into your nipples and allow the milk to air dry. In the first few weeks after giving birth, some women experience extremely full breasts (engorgement). Engorgement can make your breasts feel heavy, warm, and tender to the   touch. Engorgement peaks within 3-5 days after you give birth. The following recommendations can help ease engorgement:  Completely empty your breasts while breastfeeding or pumping. You may want to start by applying warm, moist heat (in the shower or with warm water-soaked hand towels) just before feeding or pumping. This increases circulation and helps the milk flow. If your baby does not completely empty your breasts while breastfeeding, pump any extra milk after he or she is finished.  Wear a snug bra (nursing or regular) or tank top for 1-2 days to signal your body to slightly decrease milk production.  Apply ice packs to your breasts, unless this is too uncomfortable for you.  Make sure that your baby is latched on and positioned properly while breastfeeding. If engorgement persists after 48 hours of following these recommendations, contact your health care provider or a lactation consultant. OVERALL HEALTH CARE RECOMMENDATIONS WHILE BREASTFEEDING  Eat healthy foods. Alternate between meals and snacks, eating 3 of each per day. Because what you eat affects your breast milk, some of the foods may make your baby more irritable than usual. Avoid eating these foods if you are sure that they are negatively affecting your baby.  Drink milk, fruit juice, and water to satisfy your thirst (about 10 glasses a day).   Rest often, relax, and continue to take your prenatal vitamins to prevent fatigue, stress, and anemia.  Continue  breast self-awareness checks.  Avoid chewing and smoking tobacco.  Avoid alcohol and drug use. Some medicines that may be harmful to your baby can pass through breast milk. It is important to ask your health care provider before taking any medicine, including all over-the-counter and prescription medicine as well as vitamin and herbal supplements. It is possible to become pregnant while breastfeeding. If birth control is desired, ask your health care provider about options that will be safe for your baby. SEEK MEDICAL CARE IF:   You feel like you want to stop breastfeeding or have become frustrated with breastfeeding.  You have painful breasts or nipples.  Your nipples are cracked or bleeding.  Your breasts are red, tender, or warm.  You have a swollen area on either breast.  You have a fever or chills.  You have nausea or vomiting.  You have drainage other than breast milk from your nipples.  Your breasts do not become full before feedings by the fifth day after you give birth.  You feel sad and depressed.  Your baby is too sleepy to eat well.  Your baby is having trouble sleeping.   Your baby is wetting less than 3 diapers in a 24-hour period.  Your baby has less than 3 stools in a 24-hour period.  Your baby's skin or the white part of his or her eyes becomes yellow.   Your baby is not gaining weight by 5 days of age. SEEK IMMEDIATE MEDICAL CARE IF:   Your baby is overly tired (lethargic) and does not want to wake up and feed.  Your baby develops an unexplained fever. Document Released: 11/06/2005 Document Revised: 11/11/2013 Document Reviewed: 04/30/2013 [employer] Patient Information 2015 [employer]. This information is not intended to replace advice given to you by your health care provider. Make sure you discuss any questions you have with your health care provider.  

## 2014-11-11 NOTE — Progress Notes (Signed)
25 weeks.  Stable with no complaints Denies LOF, vaginal bleeding, dysuria.  Endorses good fetal movement. U/S results from November reviewed with pt.  1 hour GTT pending/28 week labs RTC 3 weeks

## 2014-11-12 LAB — CBC
HCT: 36 % (ref 36.0–46.0)
Hemoglobin: 12.1 g/dL (ref 12.0–15.0)
MCH: 29.7 pg (ref 26.0–34.0)
MCHC: 33.6 g/dL (ref 30.0–36.0)
MCV: 88.2 fL (ref 78.0–100.0)
MPV: 9.9 fL (ref 9.4–12.4)
PLATELETS: 288 10*3/uL (ref 150–400)
RBC: 4.08 MIL/uL (ref 3.87–5.11)
RDW: 13.8 % (ref 11.5–15.5)
WBC: 8.9 10*3/uL (ref 4.0–10.5)

## 2014-11-12 LAB — HIV ANTIBODY (ROUTINE TESTING W REFLEX): HIV 1&2 Ab, 4th Generation: NONREACTIVE

## 2014-11-12 LAB — GLUCOSE TOLERANCE, 1 HOUR (50G) W/O FASTING: GLUCOSE 1 HOUR GTT: 133 mg/dL (ref 70–140)

## 2014-11-12 LAB — RPR

## 2014-11-20 NOTE — L&D Delivery Note (Cosign Needed)
Delivery Note At 3:21 PM a viable female was delivered via  (Presentation:vertex ; LOA ).  APGAR:8 ,9 ; weight  .   Placenta status:spont , via shultz.  Cord:3vc  with the following complications:none .  Cord pH: n/a  Anesthesia: Epidural  Episiotomy: none  Lacerations:  none Suture Repair: n/a Est. Blood Loss (mL):    Mom to postpartum.  Baby to Couplet care / Skin to Skin.  Wyvonnia DuskyLAWSON, Goble Fudala DARLENE 02/14/2015, 3:32 PM

## 2014-11-23 ENCOUNTER — Encounter: Payer: 59 | Admitting: Obstetrics & Gynecology

## 2014-12-02 ENCOUNTER — Ambulatory Visit (INDEPENDENT_AMBULATORY_CARE_PROVIDER_SITE_OTHER): Payer: 59 | Admitting: Obstetrics & Gynecology

## 2014-12-02 VITALS — BP 106/59 | HR 66 | Wt 152.4 lb

## 2014-12-02 DIAGNOSIS — Z3403 Encounter for supervision of normal first pregnancy, third trimester: Secondary | ICD-10-CM

## 2014-12-02 NOTE — Progress Notes (Signed)
Normal 28 week labs.  No other complaints or concerns.  Labor and fetal movement precautions reviewed.

## 2014-12-02 NOTE — Patient Instructions (Signed)
Return to clinic for any obstetric concerns or go to [facility] for evaluation  

## 2014-12-21 ENCOUNTER — Encounter: Payer: Self-pay | Admitting: Family Medicine

## 2014-12-21 ENCOUNTER — Ambulatory Visit (INDEPENDENT_AMBULATORY_CARE_PROVIDER_SITE_OTHER): Payer: Medicaid Other | Admitting: Family Medicine

## 2014-12-21 VITALS — BP 114/69 | HR 63 | Wt 155.6 lb

## 2014-12-21 DIAGNOSIS — Z3403 Encounter for supervision of normal first pregnancy, third trimester: Secondary | ICD-10-CM

## 2014-12-21 NOTE — Patient Instructions (Signed)
Third Trimester of Pregnancy The third trimester is from week 29 through week 42, months 7 through 9. The third trimester is a time when the fetus is growing rapidly. At the end of the ninth month, the fetus is about 20 inches in length and weighs 6-10 pounds.  BODY CHANGES Your body goes through many changes during pregnancy. The changes vary from woman to woman.   Your weight will continue to increase. You can expect to gain 25-35 pounds (11-16 kg) by the end of the pregnancy.  You may begin to get stretch marks on your hips, abdomen, and breasts.  You may urinate more often because the fetus is moving lower into your pelvis and pressing on your bladder.  You may develop or continue to have heartburn as a result of your pregnancy.  You may develop constipation because certain hormones are causing the muscles that push waste through your intestines to slow down.  You may develop hemorrhoids or swollen, bulging veins (varicose veins).  You may have pelvic pain because of the weight gain and pregnancy hormones relaxing your joints between the bones in your pelvis. Backaches may result from overexertion of the muscles supporting your posture.  You may have changes in your hair. These can include thickening of your hair, rapid growth, and changes in texture. Some women also have hair loss during or after pregnancy, or hair that feels dry or thin. Your hair will most likely return to normal after your baby is born.  Your breasts will continue to grow and be tender. A yellow discharge may leak from your breasts called colostrum.  Your belly button may stick out.  You may feel short of breath because of your expanding uterus.  You may notice the fetus "dropping," or moving lower in your abdomen.  You may have a bloody mucus discharge. This usually occurs a few days to a week before labor begins.  Your cervix becomes thin and soft (effaced) near your due date. WHAT TO EXPECT AT YOUR  PRENATAL EXAMS  You will have prenatal exams every 2 weeks until week 36. Then, you will have weekly prenatal exams. During a routine prenatal visit:  You will be weighed to make sure you and the fetus are growing normally.  Your blood pressure is taken.  Your abdomen will be measured to track your baby's growth.  The fetal heartbeat will be listened to.  Any test results from the previous visit will be discussed.  You may have a cervical check near your due date to see if you have effaced. At around 36 weeks, your caregiver will check your cervix. At the same time, your caregiver will also perform a test on the secretions of the vaginal tissue. This test is to determine if a type of bacteria, Group B streptococcus, is present. Your caregiver will explain this further. Your caregiver may ask you:  What your birth plan is.  How you are feeling.  If you are feeling the baby move.  If you have had any abnormal symptoms, such as leaking fluid, bleeding, severe headaches, or abdominal cramping.  If you have any questions. Other tests or screenings that may be performed during your third trimester include:  Blood tests that check for low iron levels (anemia).  Fetal testing to check the health, activity level, and growth of the fetus. Testing is done if you have certain medical conditions or if there are problems during the pregnancy. FALSE LABOR You may feel small, irregular contractions that   eventually go away. These are called Braxton Hicks contractions, or false labor. Contractions may last for hours, days, or even weeks before true labor sets in. If contractions come at regular intervals, intensify, or become painful, it is best to be seen by your caregiver.  SIGNS OF LABOR   Menstrual-like cramps.  Contractions that are 5 minutes apart or less.  Contractions that start on the top of the uterus and spread down to the lower abdomen and back.  A sense of increased pelvic  pressure or back pain.  A watery or bloody mucus discharge that comes from the vagina. If you have any of these signs before the 37th week of pregnancy, call your caregiver right away. You need to go to the hospital to get checked immediately. HOME CARE INSTRUCTIONS   Avoid all smoking, herbs, alcohol, and unprescribed drugs. These chemicals affect the formation and growth of the baby.  Follow your caregiver's instructions regarding medicine use. There are medicines that are either safe or unsafe to take during pregnancy.  Exercise only as directed by your caregiver. Experiencing uterine cramps is a good sign to stop exercising.  Continue to eat regular, healthy meals.  Wear a good support bra for breast tenderness.  Do not use hot tubs, steam rooms, or saunas.  Wear your seat belt at all times when driving.  Avoid raw meat, uncooked cheese, cat litter boxes, and soil used by cats. These carry germs that can cause birth defects in the baby.  Take your prenatal vitamins.  Try taking a stool softener (if your caregiver approves) if you develop constipation. Eat more high-fiber foods, such as fresh vegetables or fruit and whole grains. Drink plenty of fluids to keep your urine clear or pale yellow.  Take warm sitz baths to soothe any pain or discomfort caused by hemorrhoids. Use hemorrhoid cream if your caregiver approves.  If you develop varicose veins, wear support hose. Elevate your feet for 15 minutes, 3-4 times a day. Limit salt in your diet.  Avoid heavy lifting, wear low heal shoes, and practice good posture.  Rest a lot with your legs elevated if you have leg cramps or low back pain.  Visit your dentist if you have not gone during your pregnancy. Use a soft toothbrush to brush your teeth and be gentle when you floss.  A sexual relationship may be continued unless your caregiver directs you otherwise.  Do not travel far distances unless it is absolutely necessary and only  with the approval of your caregiver.  Take prenatal classes to understand, practice, and ask questions about the labor and delivery.  Make a trial run to the hospital.  Pack your hospital bag.  Prepare the baby's nursery.  Continue to go to all your prenatal visits as directed by your caregiver. SEEK MEDICAL CARE IF:  You are unsure if you are in labor or if your water has broken.  You have dizziness.  You have mild pelvic cramps, pelvic pressure, or nagging pain in your abdominal area.  You have persistent nausea, vomiting, or diarrhea.  You have a bad smelling vaginal discharge.  You have pain with urination. SEEK IMMEDIATE MEDICAL CARE IF:   You have a fever.  You are leaking fluid from your vagina.  You have spotting or bleeding from your vagina.  You have severe abdominal cramping or pain.  You have rapid weight loss or gain.  You have shortness of breath with chest pain.  You notice sudden or extreme swelling   of your face, hands, ankles, feet, or legs.  You have not felt your baby move in over an hour.  You have severe headaches that do not go away with medicine.  You have vision changes. Document Released: 10/31/2001 Document Revised: 11/11/2013 Document Reviewed: 01/07/2013 [employer] Patient Information 2015 [employer]. This information is not intended to replace advice given to you by your health care provider. Make sure you discuss any questions you have with your health care provider.  Breastfeeding Deciding to breastfeed is one of the best choices you can make for you and your baby. A change in hormones during pregnancy causes your breast tissue to grow and increases the number and size of your milk ducts. These hormones also allow proteins, sugars, and fats from your blood supply to make breast milk in your milk-producing glands. Hormones prevent breast milk from being released before your baby is born as well as prompt milk flow after birth. Once  breastfeeding has begun, thoughts of your baby, as well as his or her sucking or crying, can stimulate the release of milk from your milk-producing glands.  BENEFITS OF BREASTFEEDING For Your Baby  Your first milk (colostrum) helps your baby's digestive system function better.   There are antibodies in your milk that help your baby fight off infections.   Your baby has a lower incidence of asthma, allergies, and sudden infant death syndrome.   The nutrients in breast milk are better for your baby than infant formulas and are designed uniquely for your baby's needs.   Breast milk improves your baby's brain development.   Your baby is less likely to develop other conditions, such as childhood obesity, asthma, or type 2 diabetes mellitus.  For You   Breastfeeding helps to create a very special bond between you and your baby.   Breastfeeding is convenient. Breast milk is always available at the correct temperature and costs nothing.   Breastfeeding helps to burn calories and helps you lose the weight gained during pregnancy.   Breastfeeding makes your uterus contract to its prepregnancy size faster and slows bleeding (lochia) after you give birth.   Breastfeeding helps to lower your risk of developing type 2 diabetes mellitus, osteoporosis, and breast or ovarian cancer later in life. SIGNS THAT YOUR BABY IS HUNGRY Early Signs of Hunger  Increased alertness or activity.  Stretching.  Movement of the head from side to side.  Movement of the head and opening of the mouth when the corner of the mouth or cheek is stroked (rooting).  Increased sucking sounds, smacking lips, cooing, sighing, or squeaking.  Hand-to-mouth movements.  Increased sucking of fingers or hands. Late Signs of Hunger  Fussing.  Intermittent crying. Extreme Signs of Hunger Signs of extreme hunger will require calming and consoling before your baby will be able to breastfeed successfully. Do not  wait for the following signs of extreme hunger to occur before you initiate breastfeeding:   Restlessness.  A loud, strong cry.   Screaming. BREASTFEEDING BASICS Breastfeeding Initiation  Find a comfortable place to sit or lie down, with your neck and back well supported.  Place a pillow or rolled up blanket under your baby to bring him or her to the level of your breast (if you are seated). Nursing pillows are specially designed to help support your arms and your baby while you breastfeed.  Make sure that your baby's abdomen is facing your abdomen.   Gently massage your breast. With your fingertips, massage from your chest   wall toward your nipple in a circular motion. This encourages milk flow. You may need to continue this action during the feeding if your milk flows slowly.  Support your breast with 4 fingers underneath and your thumb above your nipple. Make sure your fingers are well away from your nipple and your baby's mouth.   Stroke your baby's lips gently with your finger or nipple.   When your baby's mouth is open wide enough, quickly bring your baby to your breast, placing your entire nipple and as much of the colored area around your nipple (areola) as possible into your baby's mouth.   More areola should be visible above your baby's upper lip than below the lower lip.   Your baby's tongue should be between his or her lower gum and your breast.   Ensure that your baby's mouth is correctly positioned around your nipple (latched). Your baby's lips should create a seal on your breast and be turned out (everted).  It is common for your baby to suck about 2-3 minutes in order to start the flow of breast milk. Latching Teaching your baby how to latch on to your breast properly is very important. An improper latch can cause nipple pain and decreased milk supply for you and poor weight gain in your baby. Also, if your baby is not latched onto your nipple properly, he or she  may swallow some air during feeding. This can make your baby fussy. Burping your baby when you switch breasts during the feeding can help to get rid of the air. However, teaching your baby to latch on properly is still the best way to prevent fussiness from swallowing air while breastfeeding. Signs that your baby has successfully latched on to your nipple:    Silent tugging or silent sucking, without causing you pain.   Swallowing heard between every 3-4 sucks.    Muscle movement above and in front of his or her ears while sucking.  Signs that your baby has not successfully latched on to nipple:   Sucking sounds or smacking sounds from your baby while breastfeeding.  Nipple pain. If you think your baby has not latched on correctly, slip your finger into the corner of your baby's mouth to break the suction and place it between your baby's gums. Attempt breastfeeding initiation again. Signs of Successful Breastfeeding Signs from your baby:   A gradual decrease in the number of sucks or complete cessation of sucking.   Falling asleep.   Relaxation of his or her body.   Retention of a small amount of milk in his or her mouth.   Letting go of your breast by himself or herself. Signs from you:  Breasts that have increased in firmness, weight, and size 1-3 hours after feeding.   Breasts that are softer immediately after breastfeeding.  Increased milk volume, as well as a change in milk consistency and color by the fifth day of breastfeeding.   Nipples that are not sore, cracked, or bleeding. Signs That Your Baby is Getting Enough Milk  Wetting at least 3 diapers in a 24-hour period. The urine should be clear and pale yellow by age 5 days.  At least 3 stools in a 24-hour period by age 5 days. The stool should be soft and yellow.  At least 3 stools in a 24-hour period by age 7 days. The stool should be seedy and yellow.  No loss of weight greater than 10% of birth weight  during the first 3   days of age.  Average weight gain of 4-7 ounces (113-198 g) per week after age 4 days.  Consistent daily weight gain by age 5 days, without weight loss after the age of 2 weeks. After a feeding, your baby may spit up a small amount. This is common. BREASTFEEDING FREQUENCY AND DURATION Frequent feeding will help you make more milk and can prevent sore nipples and breast engorgement. Breastfeed when you feel the need to reduce the fullness of your breasts or when your baby shows signs of hunger. This is called "breastfeeding on demand." Avoid introducing a pacifier to your baby while you are working to establish breastfeeding (the first 4-6 weeks after your baby is born). After this time you may choose to use a pacifier. Research has shown that pacifier use during the first year of a baby's life decreases the risk of sudden infant death syndrome (SIDS). Allow your baby to feed on each breast as long as he or she wants. Breastfeed until your baby is finished feeding. When your baby unlatches or falls asleep while feeding from the first breast, offer the second breast. Because newborns are often sleepy in the first few weeks of life, you may need to awaken your baby to get him or her to feed. Breastfeeding times will vary from baby to baby. However, the following rules can serve as a guide to help you ensure that your baby is properly fed:  Newborns (babies 4 weeks of age or younger) may breastfeed every 1-3 hours.  Newborns should not go longer than 3 hours during the day or 5 hours during the night without breastfeeding.  You should breastfeed your baby a minimum of 8 times in a 24-hour period until you begin to introduce solid foods to your baby at around 6 months of age. BREAST MILK PUMPING Pumping and storing breast milk allows you to ensure that your baby is exclusively fed your breast milk, even at times when you are unable to breastfeed. This is especially important if you are  going back to work while you are still breastfeeding or when you are not able to be present during feedings. Your lactation consultant can give you guidelines on how long it is safe to store breast milk.  A breast pump is a machine that allows you to pump milk from your breast into a sterile bottle. The pumped breast milk can then be stored in a refrigerator or freezer. Some breast pumps are operated by hand, while others use electricity. Ask your lactation consultant which type will work best for you. Breast pumps can be purchased, but some hospitals and breastfeeding support groups lease breast pumps on a monthly basis. A lactation consultant can teach you how to hand express breast milk, if you prefer not to use a pump.  CARING FOR YOUR BREASTS WHILE YOU BREASTFEED Nipples can become dry, cracked, and sore while breastfeeding. The following recommendations can help keep your breasts moisturized and healthy:  Avoid using soap on your nipples.   Wear a supportive bra. Although not required, special nursing bras and tank tops are designed to allow access to your breasts for breastfeeding without taking off your entire bra or top. Avoid wearing underwire-style bras or extremely tight bras.  Air dry your nipples for 3-4minutes after each feeding.   Use only cotton bra pads to absorb leaked breast milk. Leaking of breast milk between feedings is normal.   Use lanolin on your nipples after breastfeeding. Lanolin helps to maintain your skin's   normal moisture barrier. If you use pure lanolin, you do not need to wash it off before feeding your baby again. Pure lanolin is not toxic to your baby. You may also hand express a few drops of breast milk and gently massage that milk into your nipples and allow the milk to air dry. In the first few weeks after giving birth, some women experience extremely full breasts (engorgement). Engorgement can make your breasts feel heavy, warm, and tender to the touch.  Engorgement peaks within 3-5 days after you give birth. The following recommendations can help ease engorgement:  Completely empty your breasts while breastfeeding or pumping. You may want to start by applying warm, moist heat (in the shower or with warm water-soaked hand towels) just before feeding or pumping. This increases circulation and helps the milk flow. If your baby does not completely empty your breasts while breastfeeding, pump any extra milk after he or she is finished.  Wear a snug bra (nursing or regular) or tank top for 1-2 days to signal your body to slightly decrease milk production.  Apply ice packs to your breasts, unless this is too uncomfortable for you.  Make sure that your baby is latched on and positioned properly while breastfeeding. If engorgement persists after 48 hours of following these recommendations, contact your health care provider or a lactation consultant. OVERALL HEALTH CARE RECOMMENDATIONS WHILE BREASTFEEDING  Eat healthy foods. Alternate between meals and snacks, eating 3 of each per day. Because what you eat affects your breast milk, some of the foods may make your baby more irritable than usual. Avoid eating these foods if you are sure that they are negatively affecting your baby.  Drink milk, fruit juice, and water to satisfy your thirst (about 10 glasses a day).   Rest often, relax, and continue to take your prenatal vitamins to prevent fatigue, stress, and anemia.  Continue breast self-awareness checks.  Avoid chewing and smoking tobacco.  Avoid alcohol and drug use. Some medicines that may be harmful to your baby can pass through breast milk. It is important to ask your health care provider before taking any medicine, including all over-the-counter and prescription medicine as well as vitamin and herbal supplements. It is possible to become pregnant while breastfeeding. If birth control is desired, ask your health care provider about options that  will be safe for your baby. SEEK MEDICAL CARE IF:   You feel like you want to stop breastfeeding or have become frustrated with breastfeeding.  You have painful breasts or nipples.  Your nipples are cracked or bleeding.  Your breasts are red, tender, or warm.  You have a swollen area on either breast.  You have a fever or chills.  You have nausea or vomiting.  You have drainage other than breast milk from your nipples.  Your breasts do not become full before feedings by the fifth day after you give birth.  You feel sad and depressed.  Your baby is too sleepy to eat well.  Your baby is having trouble sleeping.   Your baby is wetting less than 3 diapers in a 24-hour period.  Your baby has less than 3 stools in a 24-hour period.  Your baby's skin or the white part of his or her eyes becomes yellow.   Your baby is not gaining weight by 5 days of age. SEEK IMMEDIATE MEDICAL CARE IF:   Your baby is overly tired (lethargic) and does not want to wake up and feed.  Your baby   develops an unexplained fever. Document Released: 11/06/2005 Document Revised: 11/11/2013 Document Reviewed: 04/30/2013 [employer] Patient Information 2015 [employer]. This information is not intended to replace advice given to you by your health care provider. Make sure you discuss any questions you have with your health care provider.  

## 2014-12-21 NOTE — Progress Notes (Signed)
Having more acid reflux lately--using TUMS Discussed indications for another u/s

## 2014-12-21 NOTE — Progress Notes (Signed)
Increased pelvic pressure on her left side.

## 2014-12-28 ENCOUNTER — Ambulatory Visit (INDEPENDENT_AMBULATORY_CARE_PROVIDER_SITE_OTHER): Payer: Medicaid Other | Admitting: Obstetrics and Gynecology

## 2014-12-28 ENCOUNTER — Encounter: Payer: Self-pay | Admitting: Obstetrics and Gynecology

## 2014-12-28 VITALS — BP 124/71 | HR 73 | Wt 157.6 lb

## 2014-12-28 DIAGNOSIS — Z3403 Encounter for supervision of normal first pregnancy, third trimester: Secondary | ICD-10-CM

## 2014-12-28 NOTE — Progress Notes (Signed)
Patient is doing well without complaints. Acid reflux well controlled with tums. FM/PTL precautions reviewed

## 2015-01-11 ENCOUNTER — Encounter: Payer: Self-pay | Admitting: Obstetrics and Gynecology

## 2015-01-11 ENCOUNTER — Ambulatory Visit (INDEPENDENT_AMBULATORY_CARE_PROVIDER_SITE_OTHER): Payer: Medicaid Other | Admitting: Obstetrics and Gynecology

## 2015-01-11 VITALS — BP 116/66 | HR 64 | Wt 160.4 lb

## 2015-01-11 DIAGNOSIS — Z3403 Encounter for supervision of normal first pregnancy, third trimester: Secondary | ICD-10-CM

## 2015-01-11 NOTE — Progress Notes (Signed)
Patient is doing well without complaints. FM/PTL precautions discussed. Cultures next visit

## 2015-01-25 ENCOUNTER — Encounter: Payer: Self-pay | Admitting: Obstetrics & Gynecology

## 2015-01-25 ENCOUNTER — Ambulatory Visit (INDEPENDENT_AMBULATORY_CARE_PROVIDER_SITE_OTHER): Payer: Medicaid Other | Admitting: Obstetrics & Gynecology

## 2015-01-25 VITALS — BP 124/66 | HR 76 | Wt 161.8 lb

## 2015-01-25 DIAGNOSIS — Z36 Encounter for antenatal screening of mother: Secondary | ICD-10-CM

## 2015-01-25 DIAGNOSIS — Z3403 Encounter for supervision of normal first pregnancy, third trimester: Secondary | ICD-10-CM

## 2015-01-25 LAB — OB RESULTS CONSOLE GC/CHLAMYDIA
Chlamydia: NEGATIVE
Gonorrhea: NEGATIVE

## 2015-01-25 NOTE — Progress Notes (Signed)
Routine visit. Good FM. No problems. Labor precautions reviewed. Cervical cultures obtained. 

## 2015-01-26 LAB — GC/CHLAMYDIA PROBE AMP, URINE
CHLAMYDIA, SWAB/URINE, PCR: NEGATIVE
GC PROBE AMP, URINE: NEGATIVE

## 2015-01-27 LAB — CULTURE, BETA STREP (GROUP B ONLY)

## 2015-02-01 ENCOUNTER — Encounter: Payer: Medicaid Other | Admitting: Obstetrics & Gynecology

## 2015-02-02 ENCOUNTER — Encounter: Payer: Medicaid Other | Admitting: Physician Assistant

## 2015-02-03 ENCOUNTER — Ambulatory Visit (INDEPENDENT_AMBULATORY_CARE_PROVIDER_SITE_OTHER): Payer: Medicaid Other | Admitting: Family Medicine

## 2015-02-03 VITALS — BP 128/70 | HR 75 | Wt 163.0 lb

## 2015-02-03 DIAGNOSIS — Z3403 Encounter for supervision of normal first pregnancy, third trimester: Secondary | ICD-10-CM

## 2015-02-03 NOTE — Progress Notes (Signed)
GBS negative. Labor precautions

## 2015-02-03 NOTE — Patient Instructions (Signed)
Third Trimester of Pregnancy The third trimester is from week 29 through week 42, months 7 through 9. The third trimester is a time when the fetus is growing rapidly. At the end of the ninth month, the fetus is about 20 inches in length and weighs 6-10 pounds.  BODY CHANGES Your body goes through many changes during pregnancy. The changes vary from woman to woman.   Your weight will continue to increase. You can expect to gain 25-35 pounds (11-16 kg) by the end of the pregnancy.  You may begin to get stretch marks on your hips, abdomen, and breasts.  You may urinate more often because the fetus is moving lower into your pelvis and pressing on your bladder.  You may develop or continue to have heartburn as a result of your pregnancy.  You may develop constipation because certain hormones are causing the muscles that push waste through your intestines to slow down.  You may develop hemorrhoids or swollen, bulging veins (varicose veins).  You may have pelvic pain because of the weight gain and pregnancy hormones relaxing your joints between the bones in your pelvis. Backaches may result from overexertion of the muscles supporting your posture.  You may have changes in your hair. These can include thickening of your hair, rapid growth, and changes in texture. Some women also have hair loss during or after pregnancy, or hair that feels dry or thin. Your hair will most likely return to normal after your baby is born.  Your breasts will continue to grow and be tender. A yellow discharge may leak from your breasts called colostrum.  Your belly button may stick out.  You may feel short of breath because of your expanding uterus.  You may notice the fetus "dropping," or moving lower in your abdomen.  You may have a bloody mucus discharge. This usually occurs a few days to a week before labor begins.  Your cervix becomes thin and soft (effaced) near your due date. WHAT TO EXPECT AT YOUR  PRENATAL EXAMS  You will have prenatal exams every 2 weeks until week 36. Then, you will have weekly prenatal exams. During a routine prenatal visit:  You will be weighed to make sure you and the fetus are growing normally.  Your blood pressure is taken.  Your abdomen will be measured to track your baby's growth.  The fetal heartbeat will be listened to.  Any test results from the previous visit will be discussed.  You may have a cervical check near your due date to see if you have effaced. At around 36 weeks, your caregiver will check your cervix. At the same time, your caregiver will also perform a test on the secretions of the vaginal tissue. This test is to determine if a type of bacteria, Group B streptococcus, is present. Your caregiver will explain this further. Your caregiver may ask you:  What your birth plan is.  How you are feeling.  If you are feeling the baby move.  If you have had any abnormal symptoms, such as leaking fluid, bleeding, severe headaches, or abdominal cramping.  If you have any questions. Other tests or screenings that may be performed during your third trimester include:  Blood tests that check for low iron levels (anemia).  Fetal testing to check the health, activity level, and growth of the fetus. Testing is done if you have certain medical conditions or if there are problems during the pregnancy. FALSE LABOR You may feel small, irregular contractions that   eventually go away. These are called Braxton Hicks contractions, or false labor. Contractions may last for hours, days, or even weeks before true labor sets in. If contractions come at regular intervals, intensify, or become painful, it is best to be seen by your caregiver.  SIGNS OF LABOR   Menstrual-like cramps.  Contractions that are 5 minutes apart or less.  Contractions that start on the top of the uterus and spread down to the lower abdomen and back.  A sense of increased pelvic  pressure or back pain.  A watery or bloody mucus discharge that comes from the vagina. If you have any of these signs before the 37th week of pregnancy, call your caregiver right away. You need to go to the hospital to get checked immediately. HOME CARE INSTRUCTIONS   Avoid all smoking, herbs, alcohol, and unprescribed drugs. These chemicals affect the formation and growth of the baby.  Follow your caregiver's instructions regarding medicine use. There are medicines that are either safe or unsafe to take during pregnancy.  Exercise only as directed by your caregiver. Experiencing uterine cramps is a good sign to stop exercising.  Continue to eat regular, healthy meals.  Wear a good support bra for breast tenderness.  Do not use hot tubs, steam rooms, or saunas.  Wear your seat belt at all times when driving.  Avoid raw meat, uncooked cheese, cat litter boxes, and soil used by cats. These carry germs that can cause birth defects in the baby.  Take your prenatal vitamins.  Try taking a stool softener (if your caregiver approves) if you develop constipation. Eat more high-fiber foods, such as fresh vegetables or fruit and whole grains. Drink plenty of fluids to keep your urine clear or pale yellow.  Take warm sitz baths to soothe any pain or discomfort caused by hemorrhoids. Use hemorrhoid cream if your caregiver approves.  If you develop varicose veins, wear support hose. Elevate your feet for 15 minutes, 3-4 times a day. Limit salt in your diet.  Avoid heavy lifting, wear low heal shoes, and practice good posture.  Rest a lot with your legs elevated if you have leg cramps or low back pain.  Visit your dentist if you have not gone during your pregnancy. Use a soft toothbrush to brush your teeth and be gentle when you floss.  A sexual relationship may be continued unless your caregiver directs you otherwise.  Do not travel far distances unless it is absolutely necessary and only  with the approval of your caregiver.  Take prenatal classes to understand, practice, and ask questions about the labor and delivery.  Make a trial run to the hospital.  Pack your hospital bag.  Prepare the baby's nursery.  Continue to go to all your prenatal visits as directed by your caregiver. SEEK MEDICAL CARE IF:  You are unsure if you are in labor or if your water has broken.  You have dizziness.  You have mild pelvic cramps, pelvic pressure, or nagging pain in your abdominal area.  You have persistent nausea, vomiting, or diarrhea.  You have a bad smelling vaginal discharge.  You have pain with urination. SEEK IMMEDIATE MEDICAL CARE IF:   You have a fever.  You are leaking fluid from your vagina.  You have spotting or bleeding from your vagina.  You have severe abdominal cramping or pain.  You have rapid weight loss or gain.  You have shortness of breath with chest pain.  You notice sudden or extreme swelling   of your face, hands, ankles, feet, or legs.  You have not felt your baby move in over an hour.  You have severe headaches that do not go away with medicine.  You have vision changes. Document Released: 10/31/2001 Document Revised: 11/11/2013 Document Reviewed: 01/07/2013 [employer] Patient Information 2015 [employer]. This information is not intended to replace advice given to you by your health care provider. Make sure you discuss any questions you have with your health care provider.  Breastfeeding Deciding to breastfeed is one of the best choices you can make for you and your baby. A change in hormones during pregnancy causes your breast tissue to grow and increases the number and size of your milk ducts. These hormones also allow proteins, sugars, and fats from your blood supply to make breast milk in your milk-producing glands. Hormones prevent breast milk from being released before your baby is born as well as prompt milk flow after birth. Once  breastfeeding has begun, thoughts of your baby, as well as his or her sucking or crying, can stimulate the release of milk from your milk-producing glands.  BENEFITS OF BREASTFEEDING For Your Baby  Your first milk (colostrum) helps your baby's digestive system function better.   There are antibodies in your milk that help your baby fight off infections.   Your baby has a lower incidence of asthma, allergies, and sudden infant death syndrome.   The nutrients in breast milk are better for your baby than infant formulas and are designed uniquely for your baby's needs.   Breast milk improves your baby's brain development.   Your baby is less likely to develop other conditions, such as childhood obesity, asthma, or type 2 diabetes mellitus.  For You   Breastfeeding helps to create a very special bond between you and your baby.   Breastfeeding is convenient. Breast milk is always available at the correct temperature and costs nothing.   Breastfeeding helps to burn calories and helps you lose the weight gained during pregnancy.   Breastfeeding makes your uterus contract to its prepregnancy size faster and slows bleeding (lochia) after you give birth.   Breastfeeding helps to lower your risk of developing type 2 diabetes mellitus, osteoporosis, and breast or ovarian cancer later in life. SIGNS THAT YOUR BABY IS HUNGRY Early Signs of Hunger  Increased alertness or activity.  Stretching.  Movement of the head from side to side.  Movement of the head and opening of the mouth when the corner of the mouth or cheek is stroked (rooting).  Increased sucking sounds, smacking lips, cooing, sighing, or squeaking.  Hand-to-mouth movements.  Increased sucking of fingers or hands. Late Signs of Hunger  Fussing.  Intermittent crying. Extreme Signs of Hunger Signs of extreme hunger will require calming and consoling before your baby will be able to breastfeed successfully. Do not  wait for the following signs of extreme hunger to occur before you initiate breastfeeding:   Restlessness.  A loud, strong cry.   Screaming. BREASTFEEDING BASICS Breastfeeding Initiation  Find a comfortable place to sit or lie down, with your neck and back well supported.  Place a pillow or rolled up blanket under your baby to bring him or her to the level of your breast (if you are seated). Nursing pillows are specially designed to help support your arms and your baby while you breastfeed.  Make sure that your baby's abdomen is facing your abdomen.   Gently massage your breast. With your fingertips, massage from your chest   wall toward your nipple in a circular motion. This encourages milk flow. You may need to continue this action during the feeding if your milk flows slowly.  Support your breast with 4 fingers underneath and your thumb above your nipple. Make sure your fingers are well away from your nipple and your baby's mouth.   Stroke your baby's lips gently with your finger or nipple.   When your baby's mouth is open wide enough, quickly bring your baby to your breast, placing your entire nipple and as much of the colored area around your nipple (areola) as possible into your baby's mouth.   More areola should be visible above your baby's upper lip than below the lower lip.   Your baby's tongue should be between his or her lower gum and your breast.   Ensure that your baby's mouth is correctly positioned around your nipple (latched). Your baby's lips should create a seal on your breast and be turned out (everted).  It is common for your baby to suck about 2-3 minutes in order to start the flow of breast milk. Latching Teaching your baby how to latch on to your breast properly is very important. An improper latch can cause nipple pain and decreased milk supply for you and poor weight gain in your baby. Also, if your baby is not latched onto your nipple properly, he or she  may swallow some air during feeding. This can make your baby fussy. Burping your baby when you switch breasts during the feeding can help to get rid of the air. However, teaching your baby to latch on properly is still the best way to prevent fussiness from swallowing air while breastfeeding. Signs that your baby has successfully latched on to your nipple:    Silent tugging or silent sucking, without causing you pain.   Swallowing heard between every 3-4 sucks.    Muscle movement above and in front of his or her ears while sucking.  Signs that your baby has not successfully latched on to nipple:   Sucking sounds or smacking sounds from your baby while breastfeeding.  Nipple pain. If you think your baby has not latched on correctly, slip your finger into the corner of your baby's mouth to break the suction and place it between your baby's gums. Attempt breastfeeding initiation again. Signs of Successful Breastfeeding Signs from your baby:   A gradual decrease in the number of sucks or complete cessation of sucking.   Falling asleep.   Relaxation of his or her body.   Retention of a small amount of milk in his or her mouth.   Letting go of your breast by himself or herself. Signs from you:  Breasts that have increased in firmness, weight, and size 1-3 hours after feeding.   Breasts that are softer immediately after breastfeeding.  Increased milk volume, as well as a change in milk consistency and color by the fifth day of breastfeeding.   Nipples that are not sore, cracked, or bleeding. Signs That Your Baby is Getting Enough Milk  Wetting at least 3 diapers in a 24-hour period. The urine should be clear and pale yellow by age 5 days.  At least 3 stools in a 24-hour period by age 5 days. The stool should be soft and yellow.  At least 3 stools in a 24-hour period by age 7 days. The stool should be seedy and yellow.  No loss of weight greater than 10% of birth weight  during the first 3   days of age.  Average weight gain of 4-7 ounces (113-198 g) per week after age 4 days.  Consistent daily weight gain by age 5 days, without weight loss after the age of 2 weeks. After a feeding, your baby may spit up a small amount. This is common. BREASTFEEDING FREQUENCY AND DURATION Frequent feeding will help you make more milk and can prevent sore nipples and breast engorgement. Breastfeed when you feel the need to reduce the fullness of your breasts or when your baby shows signs of hunger. This is called "breastfeeding on demand." Avoid introducing a pacifier to your baby while you are working to establish breastfeeding (the first 4-6 weeks after your baby is born). After this time you may choose to use a pacifier. Research has shown that pacifier use during the first year of a baby's life decreases the risk of sudden infant death syndrome (SIDS). Allow your baby to feed on each breast as long as he or she wants. Breastfeed until your baby is finished feeding. When your baby unlatches or falls asleep while feeding from the first breast, offer the second breast. Because newborns are often sleepy in the first few weeks of life, you may need to awaken your baby to get him or her to feed. Breastfeeding times will vary from baby to baby. However, the following rules can serve as a guide to help you ensure that your baby is properly fed:  Newborns (babies 4 weeks of age or younger) may breastfeed every 1-3 hours.  Newborns should not go longer than 3 hours during the day or 5 hours during the night without breastfeeding.  You should breastfeed your baby a minimum of 8 times in a 24-hour period until you begin to introduce solid foods to your baby at around 6 months of age. BREAST MILK PUMPING Pumping and storing breast milk allows you to ensure that your baby is exclusively fed your breast milk, even at times when you are unable to breastfeed. This is especially important if you are  going back to work while you are still breastfeeding or when you are not able to be present during feedings. Your lactation consultant can give you guidelines on how long it is safe to store breast milk.  A breast pump is a machine that allows you to pump milk from your breast into a sterile bottle. The pumped breast milk can then be stored in a refrigerator or freezer. Some breast pumps are operated by hand, while others use electricity. Ask your lactation consultant which type will work best for you. Breast pumps can be purchased, but some hospitals and breastfeeding support groups lease breast pumps on a monthly basis. A lactation consultant can teach you how to hand express breast milk, if you prefer not to use a pump.  CARING FOR YOUR BREASTS WHILE YOU BREASTFEED Nipples can become dry, cracked, and sore while breastfeeding. The following recommendations can help keep your breasts moisturized and healthy:  Avoid using soap on your nipples.   Wear a supportive bra. Although not required, special nursing bras and tank tops are designed to allow access to your breasts for breastfeeding without taking off your entire bra or top. Avoid wearing underwire-style bras or extremely tight bras.  Air dry your nipples for 3-4minutes after each feeding.   Use only cotton bra pads to absorb leaked breast milk. Leaking of breast milk between feedings is normal.   Use lanolin on your nipples after breastfeeding. Lanolin helps to maintain your skin's   normal moisture barrier. If you use pure lanolin, you do not need to wash it off before feeding your baby again. Pure lanolin is not toxic to your baby. You may also hand express a few drops of breast milk and gently massage that milk into your nipples and allow the milk to air dry. In the first few weeks after giving birth, some women experience extremely full breasts (engorgement). Engorgement can make your breasts feel heavy, warm, and tender to the touch.  Engorgement peaks within 3-5 days after you give birth. The following recommendations can help ease engorgement:  Completely empty your breasts while breastfeeding or pumping. You may want to start by applying warm, moist heat (in the shower or with warm water-soaked hand towels) just before feeding or pumping. This increases circulation and helps the milk flow. If your baby does not completely empty your breasts while breastfeeding, pump any extra milk after he or she is finished.  Wear a snug bra (nursing or regular) or tank top for 1-2 days to signal your body to slightly decrease milk production.  Apply ice packs to your breasts, unless this is too uncomfortable for you.  Make sure that your baby is latched on and positioned properly while breastfeeding. If engorgement persists after 48 hours of following these recommendations, contact your health care provider or a lactation consultant. OVERALL HEALTH CARE RECOMMENDATIONS WHILE BREASTFEEDING  Eat healthy foods. Alternate between meals and snacks, eating 3 of each per day. Because what you eat affects your breast milk, some of the foods may make your baby more irritable than usual. Avoid eating these foods if you are sure that they are negatively affecting your baby.  Drink milk, fruit juice, and water to satisfy your thirst (about 10 glasses a day).   Rest often, relax, and continue to take your prenatal vitamins to prevent fatigue, stress, and anemia.  Continue breast self-awareness checks.  Avoid chewing and smoking tobacco.  Avoid alcohol and drug use. Some medicines that may be harmful to your baby can pass through breast milk. It is important to ask your health care provider before taking any medicine, including all over-the-counter and prescription medicine as well as vitamin and herbal supplements. It is possible to become pregnant while breastfeeding. If birth control is desired, ask your health care provider about options that  will be safe for your baby. SEEK MEDICAL CARE IF:   You feel like you want to stop breastfeeding or have become frustrated with breastfeeding.  You have painful breasts or nipples.  Your nipples are cracked or bleeding.  Your breasts are red, tender, or warm.  You have a swollen area on either breast.  You have a fever or chills.  You have nausea or vomiting.  You have drainage other than breast milk from your nipples.  Your breasts do not become full before feedings by the fifth day after you give birth.  You feel sad and depressed.  Your baby is too sleepy to eat well.  Your baby is having trouble sleeping.   Your baby is wetting less than 3 diapers in a 24-hour period.  Your baby has less than 3 stools in a 24-hour period.  Your baby's skin or the white part of his or her eyes becomes yellow.   Your baby is not gaining weight by 5 days of age. SEEK IMMEDIATE MEDICAL CARE IF:   Your baby is overly tired (lethargic) and does not want to wake up and feed.  Your baby   develops an unexplained fever. Document Released: 11/06/2005 Document Revised: 11/11/2013 Document Reviewed: 04/30/2013 [employer] Patient Information 2015 [employer]. This information is not intended to replace advice given to you by your health care provider. Make sure you discuss any questions you have with your health care provider.  

## 2015-02-10 ENCOUNTER — Encounter: Payer: Self-pay | Admitting: Obstetrics and Gynecology

## 2015-02-10 ENCOUNTER — Ambulatory Visit (INDEPENDENT_AMBULATORY_CARE_PROVIDER_SITE_OTHER): Payer: Medicaid Other | Admitting: Obstetrics and Gynecology

## 2015-02-10 VITALS — BP 97/68 | HR 80 | Wt 162.0 lb

## 2015-02-10 DIAGNOSIS — Z3403 Encounter for supervision of normal first pregnancy, third trimester: Secondary | ICD-10-CM

## 2015-02-10 NOTE — Progress Notes (Signed)
Patient is doing well without complaints. FM/labor precautions reviewed. Patient is still doing yoga and I encouraged her to continue if able to tolerate

## 2015-02-14 ENCOUNTER — Encounter (HOSPITAL_COMMUNITY): Payer: Self-pay | Admitting: *Deleted

## 2015-02-14 ENCOUNTER — Inpatient Hospital Stay (HOSPITAL_COMMUNITY)
Admission: AD | Admit: 2015-02-14 | Discharge: 2015-02-16 | DRG: 775 | Disposition: A | Discharge status: Home / Self Care | Payer: Medicaid Other | Source: Ambulatory Visit | Attending: Family Medicine | Admitting: Family Medicine

## 2015-02-14 ENCOUNTER — Inpatient Hospital Stay (HOSPITAL_COMMUNITY): Payer: Medicaid Other | Admitting: Anesthesiology

## 2015-02-14 DIAGNOSIS — Z3403 Encounter for supervision of normal first pregnancy, third trimester: Secondary | ICD-10-CM

## 2015-02-14 DIAGNOSIS — O9962 Diseases of the digestive system complicating childbirth: Principal | ICD-10-CM | POA: Diagnosis present

## 2015-02-14 DIAGNOSIS — O26833 Pregnancy related renal disease, third trimester: Secondary | ICD-10-CM | POA: Diagnosis present

## 2015-02-14 DIAGNOSIS — K219 Gastro-esophageal reflux disease without esophagitis: Secondary | ICD-10-CM | POA: Diagnosis present

## 2015-02-14 DIAGNOSIS — O4202 Full-term premature rupture of membranes, onset of labor within 24 hours of rupture: Secondary | ICD-10-CM

## 2015-02-14 DIAGNOSIS — Z87442 Personal history of urinary calculi: Secondary | ICD-10-CM

## 2015-02-14 DIAGNOSIS — Z3A39 39 weeks gestation of pregnancy: Secondary | ICD-10-CM | POA: Diagnosis not present

## 2015-02-14 DIAGNOSIS — IMO0001 Reserved for inherently not codable concepts without codable children: Secondary | ICD-10-CM

## 2015-02-14 DIAGNOSIS — N289 Disorder of kidney and ureter, unspecified: Secondary | ICD-10-CM | POA: Diagnosis present

## 2015-02-14 LAB — CBC
HEMATOCRIT: 33.5 % — AB (ref 36.0–46.0)
Hemoglobin: 11.5 g/dL — ABNORMAL LOW (ref 12.0–15.0)
MCH: 28.8 pg (ref 26.0–34.0)
MCHC: 34.3 g/dL (ref 30.0–36.0)
MCV: 83.8 fL (ref 78.0–100.0)
Platelets: 247 10*3/uL (ref 150–400)
RBC: 4 MIL/uL (ref 3.87–5.11)
RDW: 13.6 % (ref 11.5–15.5)
WBC: 14.1 10*3/uL — ABNORMAL HIGH (ref 4.0–10.5)

## 2015-02-14 LAB — ABO/RH: ABO/RH(D): A POS

## 2015-02-14 LAB — RPR: RPR: NONREACTIVE

## 2015-02-14 LAB — TYPE AND SCREEN
ABO/RH(D): A POS
Antibody Screen: NEGATIVE

## 2015-02-14 LAB — OB RESULTS CONSOLE GBS: GBS: NEGATIVE

## 2015-02-14 LAB — AMNISURE RUPTURE OF MEMBRANE (ROM) NOT AT ARMC: Amnisure ROM: POSITIVE

## 2015-02-14 MED ORDER — OXYCODONE-ACETAMINOPHEN 5-325 MG PO TABS: 2.0000 | ORAL_TABLET | ORAL | Status: DC | PRN

## 2015-02-14 MED ORDER — OXYTOCIN 40 UNITS IN LACTATED RINGERS INFUSION - SIMPLE MED
1.0000 m[IU]/min | INTRAVENOUS | Status: DC
  Administered 2015-02-14: 2 m[IU]/min via INTRAVENOUS
  Administered 2015-02-14: 4 m[IU]/min via INTRAVENOUS
  Filled 2015-02-14: qty 1000

## 2015-02-14 MED ORDER — ONDANSETRON HCL 4 MG PO TABS: 4.0000 mg | ORAL_TABLET | ORAL | Status: DC | PRN

## 2015-02-14 MED ORDER — ONDANSETRON HCL 4 MG/2ML IJ SOLN: 4.0000 mg | Freq: Four times a day (QID) | INTRAMUSCULAR | Status: DC | PRN

## 2015-02-14 MED ORDER — PHENYLEPHRINE 40 MCG/ML (10ML) SYRINGE FOR IV PUSH (FOR BLOOD PRESSURE SUPPORT)
PREFILLED_SYRINGE | INTRAVENOUS | Status: AC
  Filled 2015-02-14: qty 20

## 2015-02-14 MED ORDER — ZOLPIDEM TARTRATE 5 MG PO TABS: 5.0000 mg | ORAL_TABLET | Freq: Every evening | ORAL | Status: DC | PRN

## 2015-02-14 MED ORDER — FENTANYL 2.5 MCG/ML BUPIVACAINE 1/10 % EPIDURAL INFUSION (WH - ANES)
INTRAMUSCULAR | Status: AC
  Administered 2015-02-14: 10:00:00
  Filled 2015-02-14: qty 125

## 2015-02-14 MED ORDER — WITCH HAZEL-GLYCERIN EX PADS: 1.0000 "application " | MEDICATED_PAD | CUTANEOUS | Status: DC | PRN

## 2015-02-14 MED ORDER — IBUPROFEN 600 MG PO TABS
600.0000 mg | ORAL_TABLET | Freq: Four times a day (QID) | ORAL | Status: DC
  Administered 2015-02-15 – 2015-02-16 (×7): 600 mg via ORAL
  Filled 2015-02-14 (×7): qty 1

## 2015-02-14 MED ORDER — OXYCODONE-ACETAMINOPHEN 5-325 MG PO TABS: 1.0000 | ORAL_TABLET | ORAL | Status: DC | PRN

## 2015-02-14 MED ORDER — LACTATED RINGERS IV SOLN: 500.0000 mL | INTRAVENOUS | Status: DC | PRN

## 2015-02-14 MED ORDER — ACETAMINOPHEN 325 MG PO TABS: 650.0000 mg | ORAL_TABLET | ORAL | Status: DC | PRN

## 2015-02-14 MED ORDER — DIPHENHYDRAMINE HCL 25 MG PO CAPS
25.0000 mg | ORAL_CAPSULE | Freq: Four times a day (QID) | ORAL | Status: DC | PRN
Start: 2015-02-14 — End: 2015-02-16

## 2015-02-14 MED ORDER — BENZOCAINE-MENTHOL 20-0.5 % EX AERO: 1.0000 "application " | INHALATION_SPRAY | CUTANEOUS | Status: DC | PRN

## 2015-02-14 MED ORDER — LACTATED RINGERS IV SOLN
INTRAVENOUS | Status: DC
  Administered 2015-02-14: 11:00:00 via INTRAVENOUS
  Administered 2015-02-14: 500 mL via INTRAVENOUS

## 2015-02-14 MED ORDER — ONDANSETRON HCL 4 MG/2ML IJ SOLN: 4.0000 mg | INTRAMUSCULAR | Status: DC | PRN

## 2015-02-14 MED ORDER — PRENATAL MULTIVITAMIN CH
1.0000 | ORAL_TABLET | Freq: Every day | ORAL | Status: DC
  Administered 2015-02-15 – 2015-02-16 (×2): 1 via ORAL
  Filled 2015-02-14 (×2): qty 1

## 2015-02-14 MED ORDER — SODIUM CHLORIDE 0.9 % IJ SOLN: 3.0000 mL | INTRAMUSCULAR | Status: DC | PRN

## 2015-02-14 MED ORDER — FENTANYL 2.5 MCG/ML BUPIVACAINE 1/10 % EPIDURAL INFUSION (WH - ANES)
INTRAMUSCULAR | Status: DC | PRN
  Administered 2015-02-14: 14 mL/h via EPIDURAL

## 2015-02-14 MED ORDER — TERBUTALINE SULFATE 1 MG/ML IJ SOLN
0.2500 mg | Freq: Once | INTRAMUSCULAR | Status: AC | PRN
  Filled 2015-02-14: qty 1

## 2015-02-14 MED ORDER — FLEET ENEMA 7-19 GM/118ML RE ENEM: 1.0000 | ENEMA | Freq: Every day | RECTAL | Status: DC | PRN

## 2015-02-14 MED ORDER — SODIUM CHLORIDE 0.9 % IJ SOLN: 3.0000 mL | Freq: Two times a day (BID) | INTRAMUSCULAR | Status: DC

## 2015-02-14 MED ORDER — SIMETHICONE 80 MG PO CHEW: 80.0000 mg | CHEWABLE_TABLET | ORAL | Status: DC | PRN

## 2015-02-14 MED ORDER — OXYTOCIN BOLUS FROM INFUSION: 500.0000 mL | INTRAVENOUS | Status: DC

## 2015-02-14 MED ORDER — CITRIC ACID-SODIUM CITRATE 334-500 MG/5ML PO SOLN: 30.0000 mL | ORAL | Status: DC | PRN

## 2015-02-14 MED ORDER — OXYTOCIN 40 UNITS IN LACTATED RINGERS INFUSION - SIMPLE MED: 62.5000 mL/h | INTRAVENOUS | Status: DC

## 2015-02-14 MED ORDER — LIDOCAINE HCL (PF) 1 % IJ SOLN
INTRAMUSCULAR | Status: DC | PRN
  Administered 2015-02-14 (×2): 4 mL

## 2015-02-14 MED ORDER — SODIUM CHLORIDE 0.9 % IV SOLN: 250.0000 mL | INTRAVENOUS | Status: DC | PRN

## 2015-02-14 MED ORDER — SENNOSIDES-DOCUSATE SODIUM 8.6-50 MG PO TABS
2.0000 | ORAL_TABLET | ORAL | Status: DC
Start: 2015-02-15 — End: 2015-02-16
  Administered 2015-02-15 (×2): 2 via ORAL
  Filled 2015-02-14 (×2): qty 2

## 2015-02-14 MED ORDER — LANOLIN HYDROUS EX OINT: TOPICAL_OINTMENT | CUTANEOUS | Status: DC | PRN

## 2015-02-14 MED ORDER — DIBUCAINE 1 % RE OINT: 1.0000 "application " | TOPICAL_OINTMENT | RECTAL | Status: DC | PRN

## 2015-02-14 MED ORDER — LIDOCAINE HCL (PF) 1 % IJ SOLN
30.0000 mL | INTRAMUSCULAR | Status: DC | PRN
  Filled 2015-02-14: qty 30

## 2015-02-14 MED ORDER — TETANUS-DIPHTH-ACELL PERTUSSIS 5-2.5-18.5 LF-MCG/0.5 IM SUSP: 0.5000 mL | Freq: Once | INTRAMUSCULAR | Status: DC

## 2015-02-14 MED ORDER — OXYTOCIN 40 UNITS IN LACTATED RINGERS INFUSION - SIMPLE MED: 62.5000 mL/h | INTRAVENOUS | Status: DC | PRN

## 2015-02-14 NOTE — H&P (Signed)
Julia Wolfe is a 34 y.o. female presenting for SROM at 0100 this morning. GBS neg now having mild contractions. Maternal Medical History:  Reason for admission: Rupture of membranes.   Contractions: Onset was less than 1 hour ago.   Frequency: irregular.   Perceived severity is mild.    Fetal activity: Perceived fetal activity is normal.   Last perceived fetal movement was within the past hour.      OB History    Gravida Para Term Preterm AB TAB SAB Ectopic Multiple Living   1 0 0 0 0 0 0 0 0 0      Past Medical History  Diagnosis Date  . Ovarian cyst   . Kidney stone    Past Surgical History  Procedure Laterality Date  . Tonsillectomy    . Ovarian cyst removal    . Kidney stone surgery    . Foot surgery    . Lasik    . Foot surgery Left 2008-2009   Family History: family history includes Cancer in her maternal aunt, maternal grandfather, maternal grandmother, and paternal grandmother. Social History:  reports that she has never smoked. She has never used smokeless tobacco. She reports that she does not drink alcohol or use illicit drugs.   Prenatal Transfer Tool  Maternal Diabetes: No Genetic Screening: Normal Maternal Ultrasounds/Referrals: Normal Fetal Ultrasounds or other Referrals:  None Maternal Substance Abuse:  No Significant Maternal Medications:  None Significant Maternal Lab Results:  None Other Comments:  None  Review of Systems  Constitutional: Negative.   HENT: Negative.   Eyes: Negative.   Respiratory: Negative.   Cardiovascular: Negative.   Gastrointestinal: Positive for abdominal pain.  Genitourinary: Negative.   Musculoskeletal: Negative.   Skin: Negative.   Neurological: Negative.   Endo/Heme/Allergies: Negative.   Psychiatric/Behavioral: Negative.     Dilation: Fingertip Effacement (%): Thick Station: Ballotable Exam by:: Marily MemosK. Koontz, RNC Blood pressure 118/72, pulse 65, temperature 97.8 F (36.6 C), temperature source Oral,  resp. rate 18, last menstrual period 05/14/2014. Maternal Exam:  Uterine Assessment: Contraction strength is mild.  Abdomen: Patient reports no abdominal tenderness. Estimated fetal weight is 7lbs.   Fetal presentation: vertex  Introitus: Normal vulva. Normal vagina.  Amniotic fluid character: clear.  Pelvis: adequate for delivery.   Cervix: Cervix evaluated by digital exam.     Fetal Exam Fetal Monitor Review: Mode: ultrasound.   Variability: moderate (6-25 bpm).   Pattern: accelerations present.    Fetal State Assessment: Category I - tracings are normal.     Physical Exam  Constitutional: She is oriented to person, place, and time. She appears well-developed and well-nourished.  HENT:  Head: Normocephalic.  Neck: Normal range of motion.  Cardiovascular: Normal rate, regular rhythm, normal heart sounds and intact distal pulses.   Respiratory: Effort normal and breath sounds normal.  GI: Soft. Bowel sounds are normal.  Genitourinary: Uterus normal.  Musculoskeletal: Normal range of motion.  Neurological: She is alert and oriented to person, place, and time. She has normal reflexes.  Skin: Skin is warm and dry.  Psychiatric: She has a normal mood and affect. Her behavior is normal. Judgment and thought content normal.    Prenatal labs: ABO, Rh:   Antibody:   Rubella: Immune (08/07 0000) RPR: NON REAC (12/23 1418)  HBsAg: Negative (08/07 1039)  HIV: NONREACTIVE (12/23 1418)  GBS: Negative (03/27 0000)   Assessment/Plan: Admit pit aug of labor.   Wyvonnia DuskyLAWSON, MARIE DARLENE 02/14/2015, 8:36 AM

## 2015-02-14 NOTE — Progress Notes (Signed)
Julia Wolfe is a 34 y.o. G1P0000 at 3952w3d by ultrasound admitted for rupture of membranes  Subjective:   Objective: BP 110/70 mmHg  Pulse 74  Temp(Src) 97.8 F (36.6 C) (Oral)  Resp 18  LMP 05/14/2014      FHT:  FHR: 135-140 bpm, variability: moderate,  accelerations:  Present,  decelerations:  Absent UC:   regular, every 2-5 minutes SVE:   Dilation: Fingertip Effacement (%): Thick Station: Ballotable Exam by:: Marily MemosK. Koontz, RNC  Labs: Lab Results  Component Value Date   WBC 14.1* 02/14/2015   HGB 11.5* 02/14/2015   HCT 33.5* 02/14/2015   MCV 83.8 02/14/2015   PLT 247 02/14/2015    Assessment / Plan: Augmentation of labor, progressing well  Labor: Progressing normally Preeclampsia:  no signs or symptoms of toxicity Fetal Wellbeing:  Category I Pain Control:  Epidural I/D:  n/a Anticipated MOD:  NSVD  LAWSON, MARIE DARLENE 02/14/2015, 11:38 AM

## 2015-02-14 NOTE — Anesthesia Procedure Notes (Signed)
Epidural Patient location during procedure: OB Start time: 02/14/2015 10:05 AM  Staffing Anesthesiologist: Mal AmabileFOSTER, Roniyah Llorens Performed by: anesthesiologist   Preanesthetic Checklist Completed: patient identified, site marked, surgical consent, pre-op evaluation, timeout performed, IV checked, risks and benefits discussed and monitors and equipment checked  Epidural Patient position: sitting Prep: site prepped and draped and DuraPrep Patient monitoring: continuous pulse ox and blood pressure Approach: midline Location: L3-L4 Injection technique: LOR air  Needle:  Needle type: Tuohy  Needle gauge: 17 G Needle length: 9 cm and 9 Needle insertion depth: 5 cm cm Catheter type: closed end flexible Catheter size: 19 Gauge Catheter at skin depth: 10 cm Test dose: negative and Other  Assessment Events: blood not aspirated, injection not painful, no injection resistance, negative IV test and no paresthesia  Additional Notes Patient identified. Risks and benefits discussed including failed block, incomplete  Pain control, post dural puncture headache, nerve damage, paralysis, blood pressure Changes, nausea, vomiting, reactions to medications-both toxic and allergic and post Partum back pain. All questions were answered. Patient expressed understanding and wished to proceed. Sterile technique was used throughout procedure. Epidural site was Dressed with sterile barrier dressing. No paresthesias, signs of intravascular injection Or signs of intrathecal spread were encountered.  Patient was more comfortable after the epidural was dosed. Please see RN's note for documentation of vital signs and FHR which are stable.

## 2015-02-14 NOTE — MAU Note (Signed)
Patient states she thinks her water broke around 0100 and presents with contractions.  Denies vaginal bleeding.  States baby moving well.

## 2015-02-14 NOTE — Anesthesia Preprocedure Evaluation (Signed)
Anesthesia Evaluation  Patient identified by MRN, date of birth, ID band Patient awake    Reviewed: Allergy & Precautions, Patient's Chart, lab work & pertinent test results  Airway Mallampati: III  TM Distance: >3 FB Neck ROM: Full    Dental no notable dental hx. (+) Teeth Intact   Pulmonary neg pulmonary ROS,  breath sounds clear to auscultation  Pulmonary exam normal       Cardiovascular negative cardio ROS  Rhythm:Regular Rate:Normal     Neuro/Psych negative neurological ROS  negative psych ROS   GI/Hepatic Neg liver ROS, GERD-  Medicated and Controlled,  Endo/Other  negative endocrine ROS  Renal/GU Renal diseaseHx/o renal calculi  negative genitourinary   Musculoskeletal negative musculoskeletal ROS (+)   Abdominal (+) - obese,   Peds  Hematology  (+) anemia ,   Anesthesia Other Findings   Reproductive/Obstetrics (+) Pregnancy                             Anesthesia Physical Anesthesia Plan  ASA: II  Anesthesia Plan: Epidural   Post-op Pain Management:    Induction:   Airway Management Planned: Natural Airway  Additional Equipment:   Intra-op Plan:   Post-operative Plan:   Informed Consent: I have reviewed the patients History and Physical, chart, labs and discussed the procedure including the risks, benefits and alternatives for the proposed anesthesia with the patient or authorized representative who has indicated his/her understanding and acceptance.     Plan Discussed with: Anesthesiologist  Anesthesia Plan Comments:         Anesthesia Quick Evaluation

## 2015-02-15 NOTE — Progress Notes (Signed)
Post Partum Day 1 Subjective: no complaints, up ad lib, voiding and tolerating PO  Objective: Blood pressure 95/53, pulse 70, temperature 97.7 F (36.5 C), temperature source Oral, resp. rate 18, last menstrual period 05/14/2014, unknown if currently breastfeeding.  Physical Exam:  General: alert, cooperative, appears stated age and no distress Lochia: appropriate Uterine Fundus: firm Incision: n/a DVT Evaluation: No evidence of DVT seen on physical exam. Negative Homan's sign. No cords or calf tenderness. No significant calf/ankle edema.   Recent Labs  02/14/15 0650  HGB 11.5*  HCT 33.5*    Assessment/Plan: Plan for discharge tomorrow   LOS: 1 day   Chiquita Heckert DARLENE 02/15/2015, 7:15 AM

## 2015-02-15 NOTE — Progress Notes (Signed)
UR chart review completed.  

## 2015-02-15 NOTE — Anesthesia Postprocedure Evaluation (Signed)
  Anesthesia Post-op Note  Patient: Julia Wolfe  Procedure(s) Performed: * No procedures listed *  Patient Location: Mother/Baby  Anesthesia Type:Epidural  Level of Consciousness: awake  Airway and Oxygen Therapy: Patient Spontanous Breathing  Post-op Pain: mild  Post-op Assessment: Patient's Cardiovascular Status Stable and Respiratory Function Stable  Post-op Vital Signs: stable  Last Vitals:  Filed Vitals:   02/15/15 0550  BP: 95/53  Pulse: 70  Temp: 36.5 C  Resp: 18    Complications: No apparent anesthesia complications

## 2015-02-15 NOTE — Lactation Note (Signed)
This note was copied from the chart of Julia Wolfe. Lactation Consultation Note Follow up visit made.  Baby just had a good feeding at breast with nipple shield per mom.  She reports this has been the best feeding yet and she could feel good pulls.  Mom has not pumped since early this AM.  Instructed to post pump every 3 hours x 15 minutes and give the milk to baby with spoon or syringe.  Encouraged to call out for assist/concerns prn.  Patient Name: Julia Wolfe ZOXWR'UToday's Date: 02/15/2015     Maternal Data    Feeding Feeding Type: Breast Fed Length of feed: 10 min  LATCH Score/Interventions Latch: Repeated attempts needed to sustain latch, nipple held in mouth throughout feeding, stimulation needed to elicit sucking reflex. Intervention(s): Skin to skin;Teach feeding cues;Waking techniques Intervention(s): Adjust position;Assist with latch;Breast massage;Breast compression  Audible Swallowing: None Intervention(s): Skin to skin  Type of Nipple: Flat Intervention(s): Double electric pump;Reverse pressure  Comfort (Breast/Nipple): Soft / non-tender     Hold (Positioning): Assistance needed to correctly position infant at breast and maintain latch. Intervention(s): Breastfeeding basics reviewed;Support Pillows;Position options;Skin to skin  LATCH Score: 5  Lactation Tools Discussed/Used Tools: Nipple Shields Nipple shield size: 16   Consult Status      Julieann Drummonds S 02/15/2015, 2:23 PM

## 2015-02-15 NOTE — Lactation Note (Addendum)
This note was copied from the chart of Julia Wolfe. Lactation Consultation Note New mom called out for assistance. Breast are filling! No knots, but hard to hand express. ICE applied and encouraged mom to massage breast. Expressed 3ml colostrum. Mom has flat nipples. Rt. Areola w/edema. Reverse pressure but as hand expressed edema appeared. Noted pitting edema to Rt. Foot. Lt. Foot no edema. Mom shown how to use DEBP & how to disassemble, clean, & reassemble parts. Mom knows to pump q3h for 15-20 min. Mom pre-pumped to relieve some of the fullness, collected 3ml colostrum.   Mom taught how to apply & clean nipple shield. Fitted #16 NS.  Stimulated baby to wake up. Applied NS, w/curve tip syring inserted colostrum. Assessed baby's suck to wake him up. Has strong clamp and high palate. Baby finally sucked approximately 5 minutes w/a lot of stimulation. Baby not interested in BF at this time. Took the 3ml of colostrum in NS. Mom massaged some on breast at intervals during BF.  Mom encouraged to do skin-to-skin. Mom encouraged to feed baby 8-12 times/24 hours and with feeding cues. Educated about newborn behavior. Mom encouraged to waken baby for feeds. Referred to Baby and Me Book in Breastfeeding section Pg. 22-23 for position options and Proper latch demonstration. Encouraged comfort during BF so colostrum flows better and mom will enjoy the feeding longer. Taking deep breaths and breast massage during BF.  Mom isn't sure how long she is going to BF. She recently lost her job, she doesn't have WIC. She does have [employer]. She doesn't have a pump at home. Hand pump given. Shells in room, but d/t filling she shouldn't wear them at this time.  Mom stated that the baby has BF only one good time and it hurt. Showed picture of deep latch and shallow latch and encouraged to wear NS.  Discussed engorgement, mom asked how do people who do not want to BF dry their milk up when they become engorged. Not  sure how committed mom is about BF. Mom told me she wasn't sure, she was just going to see how it went. With RN assistance mom hand expressed and spoon fed baby earlier.  Mom encouraged to apply #16 NS, BF baby and if the baby doesn't empty her breast then she can post pump to relieve. Moms breast are very large. Stated they went up 2-3 bra sizes. WH/LC brochure given w/resources, support groups and LC services.  Patient Name: Julia Pablo Mathurin WUJWJ'X Date: 02/15/2015 Reason for consult: Initial assessment   Maternal Data Has patient been taught Hand Expression?: Yes Does the patient have breastfeeding experience prior to this delivery?: No  Feeding Feeding Type: Breast Milk Length of feed: 5 min  LATCH Score/Interventions Latch: Repeated attempts needed to sustain latch, nipple held in mouth throughout feeding, stimulation needed to elicit sucking reflex. Intervention(s): Skin to skin;Teach feeding cues;Waking techniques Intervention(s): Adjust position;Assist with latch;Breast massage;Breast compression  Audible Swallowing: A few with stimulation Intervention(s): Skin to skin;Hand expression;Alternate breast massage  Type of Nipple: Flat Intervention(s): Reverse pressure;Double electric pump;Hand pump  Comfort (Breast/Nipple): Filling, red/small blisters or bruises, mild/mod discomfort  Problem noted: Filling Interventions (Filling): Massage;Reverse pressure;Frequent nursing;Double electric pump  Hold (Positioning): Assistance needed to correctly position infant at breast and maintain latch. Intervention(s): Breastfeeding basics reviewed;Support Pillows;Position options;Skin to skin  LATCH Score: 5  Lactation Tools Discussed/Used Tools: Nipple Dorris Carnes;Pump Nipple shield size: 16 Breast pump type: Double-Electric Breast Pump WIC Program: No Pump Review: Setup, frequency, and  cleaning;Milk Storage Initiated by:: Peri JeffersonL. Damarkus Balis RN Date initiated:: 02/15/15   Consult  Status Consult Status: Follow-up Date: 02/15/15 (in pm) Follow-up type: In-patient    Charyl DancerCARVER, Raechelle Sarti G 02/15/2015, 5:26 AM

## 2015-02-16 MED ORDER — IBUPROFEN 600 MG PO TABS: 600.0000 mg | ORAL_TABLET | Freq: Four times a day (QID) | ORAL | Status: DC

## 2015-02-16 NOTE — Discharge Instructions (Signed)

## 2015-02-16 NOTE — Discharge Summary (Signed)
  Obstetric Discharge Summary Reason for Admission: onset of labor Prenatal Procedures: none Intrapartum Procedures: spontaneous vaginal delivery Postpartum Procedures: none Complications-Operative and Postpartum: none  Delivery Note At 3:21 PM a viable female was delivered via Vaginal, Spontaneous Delivery (Presentation: Right Occiput Anterior).  APGAR: 9, 9; weight 6 lb 3.7 oz (2825 g).   Placenta status: Intact, Spontaneous.  Cord: 3 vessels with the following complications: None.   Anesthesia: Epidural  Episiotomy: None Lacerations: None Suture Repair: N/A Est. Blood Loss (mL): 200  Mom to postpartum.  Baby to Couplet care / Skin to Skin.   Hospital Course:  Active Problems:   Active labor   Julia Wolfe is a 34 y.o. G1P1001 s/p SVD at PPD#2.  Patient presented was admitted to L&D.  She has postpartum course that was uncomplicated including no problems with ambulating, PO intake, urination, pain, or bleeding. The pt feels ready to go home and  will be discharged with outpatient follow-up.   Today: No acute events overnight.  Pt denies problems with ambulating, voiding or po intake.  She denies nausea or vomiting.  Pain is well controlled.  She has had flatus. She has had bowel movement.  Lochia Minimal.  Endorses minimal rectal pain, but improving. Plan for birth control is Paragard copper IUD.  Method of Feeding: Breast. Outpatient circumcision for baby boy [name].    H/H: Lab Results  Component Value Date/Time   HGB 11.5* 02/14/2015 06:50 AM   HCT 33.5* 02/14/2015 06:50 AM    Discharge Diagnoses: Term Pregnancy-delivered  Discharge Information: Date: 02/16/2015 Activity: pelvic rest Diet: routine  Medications: Ibuprofen Breast feeding:  Yes Condition: stable Instructions: refer to handout Discharge to: home      Medication List    TAKE these medications        ibuprofen 600 MG tablet  Commonly known as:  ADVIL,MOTRIN  Take 1 tablet (600 mg total)  by mouth every 6 (six) hours.     prenatal vitamin w/FE, FA 29-1 MG Chew chewable tablet  Chew 1 tablet by mouth daily at 12 noon.     ranitidine 75 MG tablet  Commonly known as:  ZANTAC  Take 75 mg by mouth daily.        Follow-up Information    [facility].   Specialty:  Obstetrics and Gynecology   Contact information:   [address] [place] [phone]        Jeralyn RuthsNicholas A Wilkinson, Medical Student 02/16/2015,7:38 AM    OB fellow attestation I have seen and examined this patient and agree with above documentation in the student's note.   Julia Wolfe is a 34 y.o. G1P1001 s/p NSVD. Postpartum course has been uncomplicated.  Pain is well controlled.  Plan for birth control is IUD.  Method of Feeding: breast  PE:  BP 108/63 mmHg  Pulse 58  Temp(Src) 97.6 F (36.4 C) (Oral)  Resp 19  Ht 5\' 7"  (1.702 m)  Wt 162 lb (73.483 kg)  BMI 25.37 kg/m2  LMP 05/14/2014  Breastfeeding? Unknown Fundus firm   Recent Labs  02/14/15 0650  HGB 11.5*  HCT 33.5*     Plan: discharge today - postpartum care discussed - f/u clinic in 6 weeks for postpartum visit   William DaltonMcEachern, Trusten Hume, MD 7:47 AM

## 2015-02-16 NOTE — Lactation Note (Addendum)
This note was copied from the chart of Julia Wolfe. Lactation Consultation Note: follow up visit with mom. Baby is fussy in room, Mom reports he fed about 1 hour ago. Assisted with latch. Encouraged mom to keep baby close to the breast- she is letting him slide to the tip of NS. Reports he has latched to bare breast without NS once on right breast. Tried without NS but baby too fussy. Latched and nursed for about 5 min then off and sleepy. Reports he has had some better feedings. Mom had been pumping and cup feeding EBM to baby after nursing. To continue same plan. Nipples intact but pink. Comfort gels given with instructions for use. Does not have pump for home except manual- [facility] appointment made for Friday 4/1 at 4 pm. No questions at present. To call prn  Patient Name: Julia Wolfe ZOXWR'UToday's Date: 02/16/2015 Reason for consult: Follow-up assessment   Maternal Data Formula Feeding for Exclusion: No Has patient been taught Hand Expression?: Yes Does the patient have breastfeeding experience prior to this delivery?: No  Feeding Feeding Type: Breast Fed Length of feed: 5 min  LATCH Score/Interventions Latch: Repeated attempts needed to sustain latch, nipple held in mouth throughout feeding, stimulation needed to elicit sucking reflex. Intervention(s): Adjust position;Assist with latch  Audible Swallowing: A few with stimulation  Type of Nipple: Everted at rest and after stimulation (short)  Comfort (Breast/Nipple): Filling, red/small blisters or bruises, mild/mod discomfort  Problem noted: Mild/Moderate discomfort Interventions (Filling): Double electric pump;Hand pump Interventions (Mild/moderate discomfort): Comfort gels;Hand expression  Hold (Positioning): Assistance needed to correctly position infant at breast and maintain latch. Intervention(s): Breastfeeding basics reviewed  LATCH Score: 6  Lactation Tools  Discussed/Used Nipple shield size: 16 WIC Program: No   Consult Status Consult Status: Follow-up Date: 02/19/15 Follow-up type: Out-patient    Pamelia HoitWeeks, Gorden Stthomas D 02/16/2015, 8:59 AM

## 2015-02-17 ENCOUNTER — Encounter: Payer: Medicaid Other | Admitting: Obstetrics & Gynecology

## 2015-02-19 ENCOUNTER — Ambulatory Visit (HOSPITAL_COMMUNITY): Payer: Medicaid Other

## 2015-03-18 ENCOUNTER — Encounter: Payer: Self-pay | Admitting: Obstetrics & Gynecology

## 2015-03-18 ENCOUNTER — Ambulatory Visit (INDEPENDENT_AMBULATORY_CARE_PROVIDER_SITE_OTHER): Payer: Medicaid Other | Admitting: Obstetrics & Gynecology

## 2015-03-18 DIAGNOSIS — Z30430 Encounter for insertion of intrauterine contraceptive device: Secondary | ICD-10-CM

## 2015-03-18 DIAGNOSIS — Z3202 Encounter for pregnancy test, result negative: Secondary | ICD-10-CM | POA: Diagnosis not present

## 2015-03-18 DIAGNOSIS — F53 Postpartum depression: Secondary | ICD-10-CM

## 2015-03-18 DIAGNOSIS — O99345 Other mental disorders complicating the puerperium: Secondary | ICD-10-CM

## 2015-03-18 LAB — POCT URINE PREGNANCY: PREG TEST UR: NEGATIVE

## 2015-03-18 MED ORDER — SERTRALINE HCL 50 MG PO TABS: 50.0000 mg | ORAL_TABLET | Freq: Every day | ORAL | Status: DC

## 2015-03-18 MED ORDER — PARAGARD INTRAUTERINE COPPER IU IUD: 1.0000 [IU] | INTRAUTERINE_SYSTEM | Freq: Once | INTRAUTERINE | Status: DC

## 2015-03-18 NOTE — Patient Instructions (Signed)

## 2015-03-18 NOTE — Progress Notes (Signed)
    Subjective:     Julia Wolfe is a 34 y.o. 31P1001 female who presents for a postpartum visit. She is 4 weeks postpartum following a spontaneous vaginal delivery. I have fully reviewed the prenatal and intrapartum course. The delivery was at 39.3 gestational weeks.  Anesthesia: epidural. Postpartum course has been uncomplications. Baby's course has been uncomplicated. Baby is feeding by bottle.  Bleeding no bleeding. Bowel function is normal. Bladder function is normal. Patient is not sexually active. Contraception method is IUD/Paragard. Postpartum depression screening: positive with score of 16.  Patient feels very overwhelmed with the baby, spends most of the time with baby, husband works 10 hours/day. No other support.  Denies any SI/HI; husband who is accompanying her today does not report any other symptoms.  The following portions of the patient's history were reviewed and updated as appropriate: allergies, current medications, past family history, past medical history, past social history, past surgical history and problem list.  Normal pap on 06/26/14.  Review of Systems Pertinent items are noted in HPI.   Objective:    BP 117/80 mmHg  Pulse 67  Wt 153 lb (69.4 kg)  Breastfeeding? No  General:  alert and no distress   Breasts:  inspection negative, no nipple discharge or bleeding, no masses or nodularity palpable  Lungs: clear to auscultation bilaterally  Heart:  regular rate and rhythm  Abdomen: soft, non-tender; bowel sounds normal; no masses,  no organomegaly   Vulva:  normal  Vagina: normal vagina  Cervix:  multiparous appearance  Corpus: normal size, contour, position, consistency, mobility, non-tender  Adnexa:  normal adnexa and no mass, fullness, tenderness  Rectal Exam: Not performed.    Paragard IUD Insertion Procedure Note Patient identified, informed consent performed, consent signed.   Discussed risks of irregular bleeding, cramping, infection,  malpositioning or misplacement of the IUD outside the uterus which may require further procedure such as laparoscopy. Time out was performed.  Urine pregnancy test negative.  Speculum placed in the vagina.  Cervix visualized.  Cleaned with Betadine x 2.  Grasped anteriorly with a single tooth tenaculum.  Uterus sounded to 8 cm.  Paragard IUD placed per manufacturer's recommendations.  Strings trimmed to 3 cm. Tenaculum was removed, good hemostasis noted.  Patient tolerated procedure well.    Assessment:   Normal postpartum exam. Paragard IUD placed today.  Postpartum depression  Plan:   Contraception: Paragard IUD.  Patient was given post-procedure instructions.  She was advised to have backup contraception for one week.  Patient was also asked to check IUD strings periodically and follow up in 4 weeks for IUD check.  Recommended SW consult, mental health provider +/- medication. Patient wants to try medication for now, will follow up response.  Common risks/adverse effects of SSRIs discussed, Zoloft 50 mg po daily prescribed.  Will follow up in one month. SI/HI precautions given; also advised to call/go to ER with any worsening symptoms.  Routine preventative health maintenance measures emphasized.   Jaynie CollinsUGONNA  Marvel Mcphillips, MD, FACOG Attending Obstetrician & Gynecologist [facility]

## 2015-04-15 ENCOUNTER — Ambulatory Visit (INDEPENDENT_AMBULATORY_CARE_PROVIDER_SITE_OTHER): Payer: Medicaid Other | Admitting: Obstetrics & Gynecology

## 2015-04-15 ENCOUNTER — Encounter: Payer: Self-pay | Admitting: Obstetrics & Gynecology

## 2015-04-15 VITALS — BP 118/76 | HR 68 | Wt 154.0 lb

## 2015-04-15 DIAGNOSIS — F53 Postpartum depression: Secondary | ICD-10-CM

## 2015-04-15 DIAGNOSIS — N920 Excessive and frequent menstruation with regular cycle: Secondary | ICD-10-CM

## 2015-04-15 DIAGNOSIS — Z30431 Encounter for routine checking of intrauterine contraceptive device: Secondary | ICD-10-CM | POA: Diagnosis not present

## 2015-04-15 DIAGNOSIS — O99345 Other mental disorders complicating the puerperium: Secondary | ICD-10-CM

## 2015-04-15 MED ORDER — TRANEXAMIC ACID 650 MG PO TABS
1300.0000 mg | ORAL_TABLET | Freq: Three times a day (TID) | ORAL | Status: DC
Start: 2015-04-15 — End: 2016-05-09

## 2015-04-15 NOTE — Patient Instructions (Signed)
Return to clinic for any scheduled appointments or for any gynecologic concerns as needed.   

## 2015-04-15 NOTE — Progress Notes (Signed)
     GYNECOLOGY CLINIC PROGRESS NOTE  History:  34 y.o. G1P1001 here today for today for IUD string check; Paragard IUD was placed  03/18/15.   Patient reports having a very heavy menstrual period, "heavier than any period in my life". No anemia symptoms.   No other concerning side effects.  Patient is also here to follow up about postpartum depression, was started on Zoloft 50 mg daily.  Reports feeling much better, has been talking to mother and sister, husband has "picked up the slack" and is more helpful with the baby.  Declines any SW or mental health provider referral.   The following portions of the patient's history were reviewed and updated as appropriate: allergies, current medications, past family history, past medical history, past social history, past surgical history and problem list. Last pap smear on 06/26/14 was normal.  Review of Systems:  Pertinent items are noted in HPI.  Objective:  Physical Exam Blood pressure 118/76, pulse 68, weight 154 lb (69.854 kg), last menstrual period 04/08/2015, not currently breastfeeding. BP 118/76 mmHg  Pulse 68  Wt 154 lb (69.854 kg)  LMP 04/08/2015  Breastfeeding? No CONSTITUTIONAL: Well-developed, well-nourished female in no acute distress.  HENT:  Normocephalic, atraumatic, External right and left ear normal. Oropharynx is clear and moist EYES: Conjunctivae and EOM are normal. Pupils are equal, round, and reactive to light. No scleral icterus.  NECK: Normal range of motion, supple, no masses.  Normal thyroid.  SKIN: Skin is warm and dry. No rash noted. Not diaphoretic. No erythema. No pallor. NEUROLGIC: Alert and oriented to person, place, and time. Normal reflexes, muscle tone coordination. No cranial nerve deficit noted. PSYCHIATRIC: Normal mood and affect. Normal behavior. Normal judgment and thought content. CARDIOVASCULAR: Normal heart rate noted RESPIRATORY:  Effort and breath sounds normal, no problems with respiration  noted. MUSCULOSKELETAL: Normal range of motion. No tenderness.  No cyanosis, clubbing, or edema.  2+ distal pulses. ABDOMEN: Soft, no tenderness, rebound or guarding.  PELVIC: Normal appearing external genitalia; normal appearing vaginal mucosa and cervix.  IUD strings visualized, about 3 cm in length outside cervix.  Assessment & Plan:  Stable postpartum depression on Zoloft, depression precautions reviewed. Normal IUD check. Patient to keep IUD in place for ten years; can come in for removal if she desires pregnancy within the next five years. Was assured that heavy periods can occur with Paragard especially in the first few months after placement. Recommended that Lysteda can be used to help reduce amount of bleeding, patient wants to try this medication.  This was prescribed for her to use as needed.  Routine preventative health maintenance measures emphasized.  Julia CollinsUGONNA  Julia Lose, MD, FACOG Attending Obstetrician & Gynecologist Center for [facility], [facility]

## 2015-07-06 ENCOUNTER — Ambulatory Visit (INDEPENDENT_AMBULATORY_CARE_PROVIDER_SITE_OTHER): Payer: Medicaid Other | Admitting: Obstetrics & Gynecology

## 2015-07-06 ENCOUNTER — Encounter: Payer: Self-pay | Admitting: Obstetrics & Gynecology

## 2015-07-06 VITALS — BP 111/65 | HR 73 | Resp 18 | Wt 161.0 lb

## 2015-07-06 DIAGNOSIS — Z30432 Encounter for removal of intrauterine contraceptive device: Secondary | ICD-10-CM | POA: Diagnosis not present

## 2015-07-06 DIAGNOSIS — N920 Excessive and frequent menstruation with regular cycle: Secondary | ICD-10-CM

## 2015-07-06 DIAGNOSIS — T8389XA Other specified complication of genitourinary prosthetic devices, implants and grafts, initial encounter: Principal | ICD-10-CM

## 2015-07-06 MED ORDER — ETONOGESTREL-ETHINYL ESTRADIOL 0.12-0.015 MG/24HR VA RING: VAGINAL_RING | VAGINAL | Status: DC

## 2015-07-06 NOTE — Progress Notes (Signed)
   Subjective:    Patient ID: Julia Wolfe, female    DOB: Dec 27, 1980, 34 y.o.   MRN: [id number]  HPI  34 yo SW P1 is here to have her Paragard removed. She continues with heavy periods and is dinstinctly unhappy with it. She used the Nuvaring for 8 years and wants to restart that method.   Review of Systems     Objective:   Physical Exam [id number] Abd- benign Paragard IUD easily removed and noted to be intact      Assessment & Plan:  Contraception- Nuvaring. Rec backup method for 1 month RTC 1 year for annual

## 2015-07-06 NOTE — Progress Notes (Signed)
Pt had IUD inserted in April and has been having heavy irregular menstrual cycles with severe abdominal cramps.

## 2015-11-22 ENCOUNTER — Other Ambulatory Visit (INDEPENDENT_AMBULATORY_CARE_PROVIDER_SITE_OTHER): Payer: Medicaid Other | Admitting: *Deleted

## 2015-11-22 DIAGNOSIS — R300 Dysuria: Secondary | ICD-10-CM

## 2015-11-22 LAB — POCT URINALYSIS DIPSTICK
Bilirubin, UA: NEGATIVE
Glucose, UA: NEGATIVE
Ketones, UA: NEGATIVE
Nitrite, UA: NEGATIVE
PROTEIN UA: NEGATIVE
SPEC GRAV UA: 1.01
Urobilinogen, UA: 0.2
pH, UA: 7

## 2015-11-22 MED ORDER — PHENAZOPYRIDINE HCL 200 MG PO TABS
200.0000 mg | ORAL_TABLET | Freq: Three times a day (TID) | ORAL | Status: DC | PRN
Start: 2015-11-22 — End: 2016-05-09

## 2015-11-22 MED ORDER — SULFAMETHOXAZOLE-TRIMETHOPRIM 800-160 MG PO TABS: 1.0000 | ORAL_TABLET | Freq: Two times a day (BID) | ORAL | Status: DC

## 2015-11-22 NOTE — Progress Notes (Signed)
Pt came in office c/o discomfort when she first starts to urinate, states symptoms are consistent with a bladder infection that she has had in the past.  UA + for blood and leukocytes.  Will send Septra DS to pharmacy and Pyridium for discomfort.  Urine culture sent to lab, will call pt in a few days with result if we need to change antibiotics.  Encouraged to push fluids and call office with any additional concerns.

## 2015-11-24 LAB — URINE CULTURE
Colony Count: NO GROWTH
Organism ID, Bacteria: NO GROWTH

## 2016-04-19 IMAGING — US US OB COMP +14 WK
2 series · 13 of 28 positions shown · non-contrast
Comparison: none

[Series 1: us ob comp +14 wk · 11 of 85 slices shown (1 of 2)]
[im 4/85]
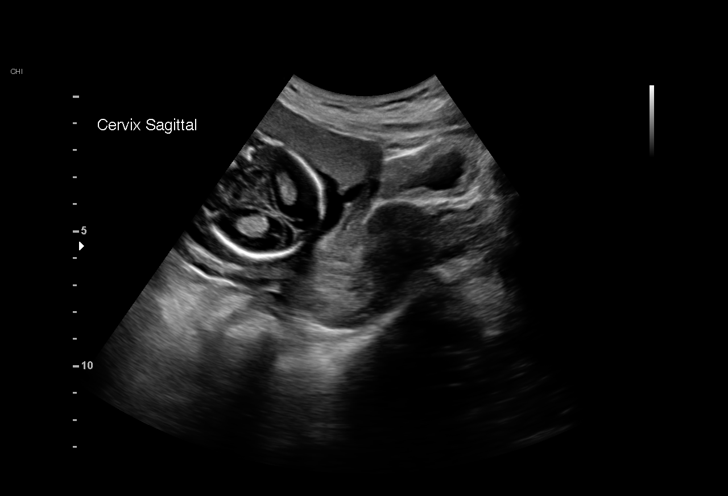
[im 11/85]
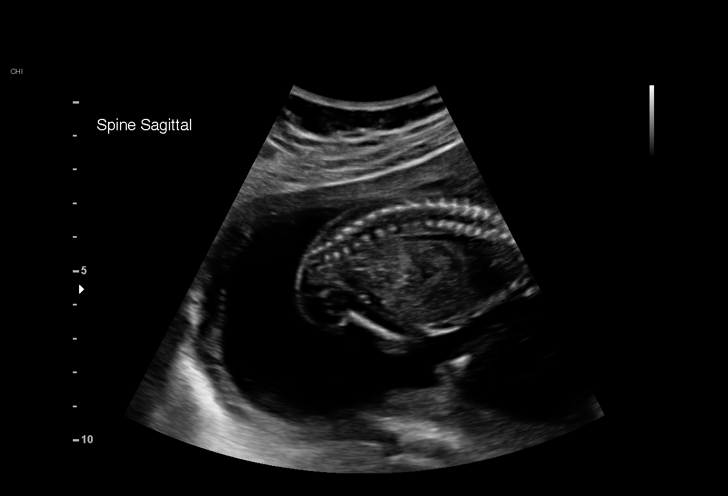
[im 19/85]
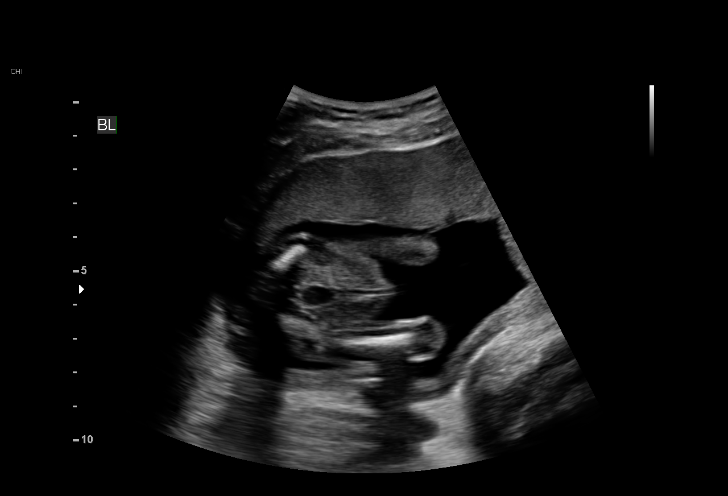
[im 26/85]
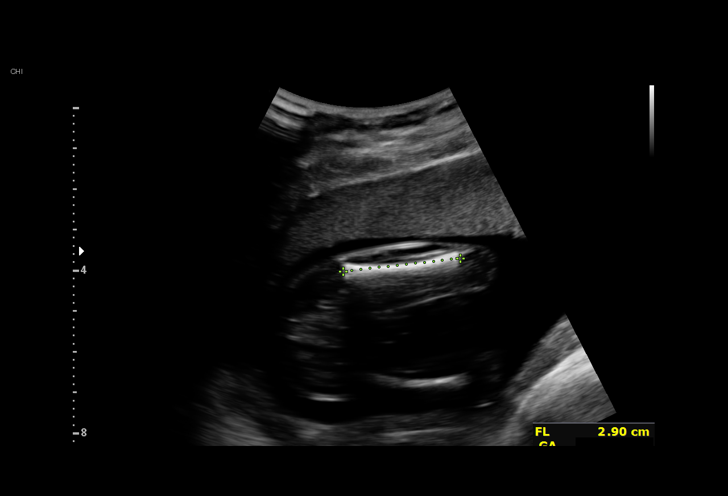
[im 33/85]
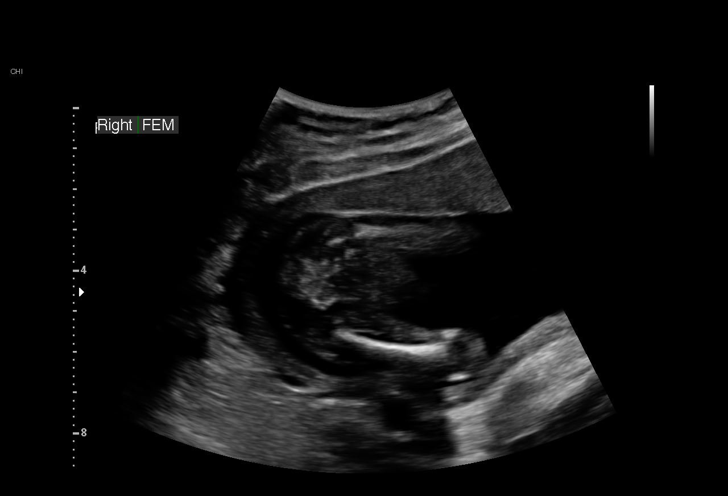
[im 41/85]
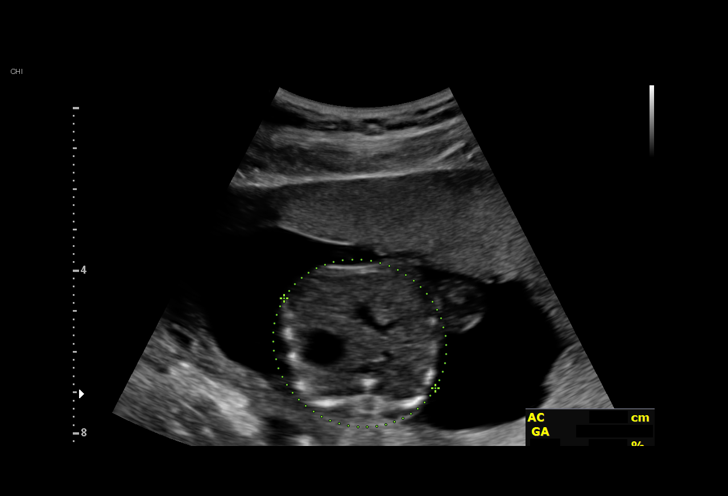
[im 52/85]
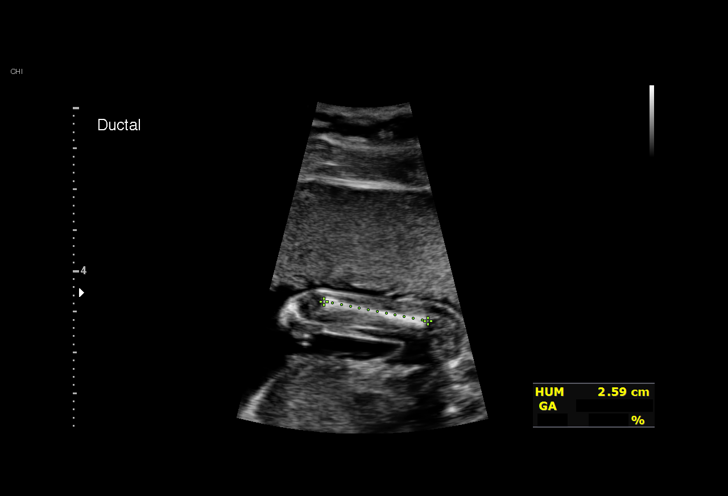
[im 59/85]
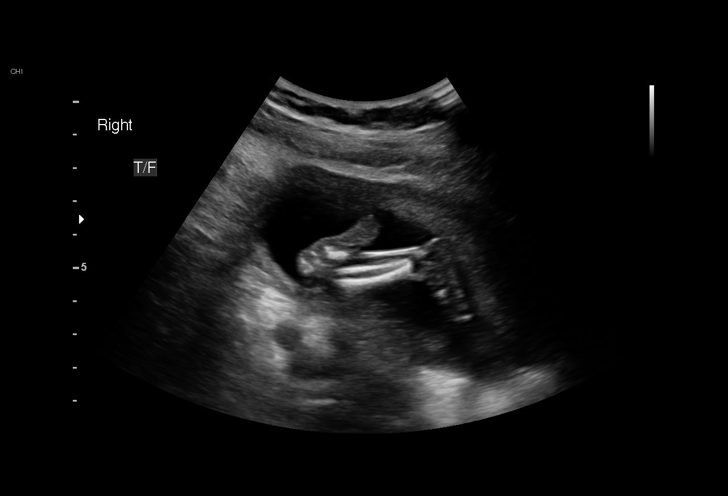
[im 66/85]
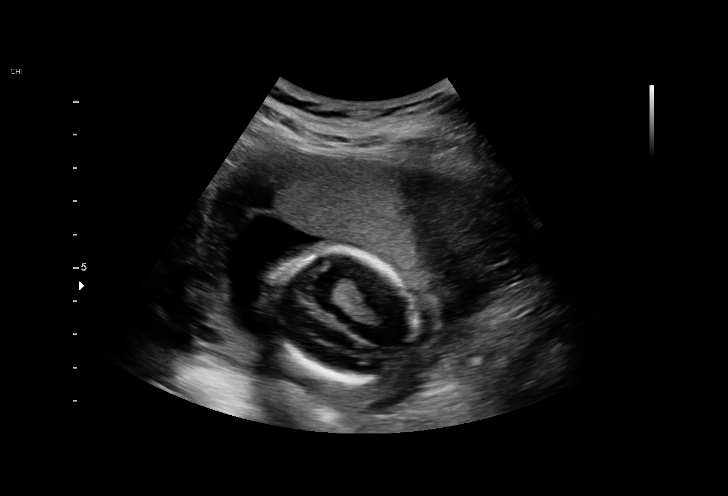
[im 74/85]
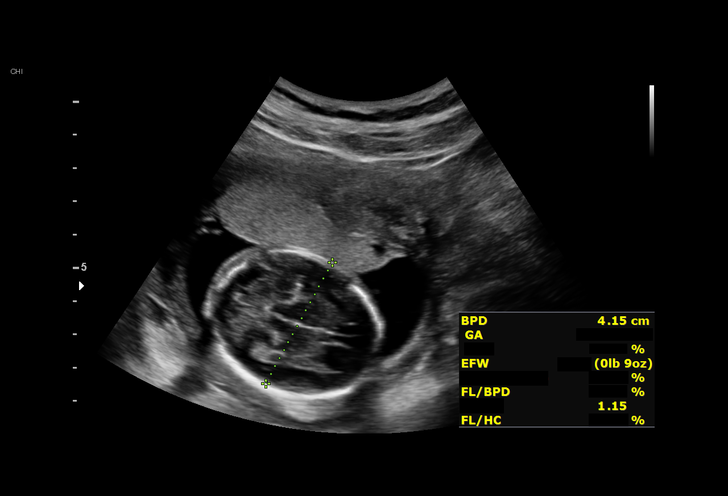
[im 81/85]
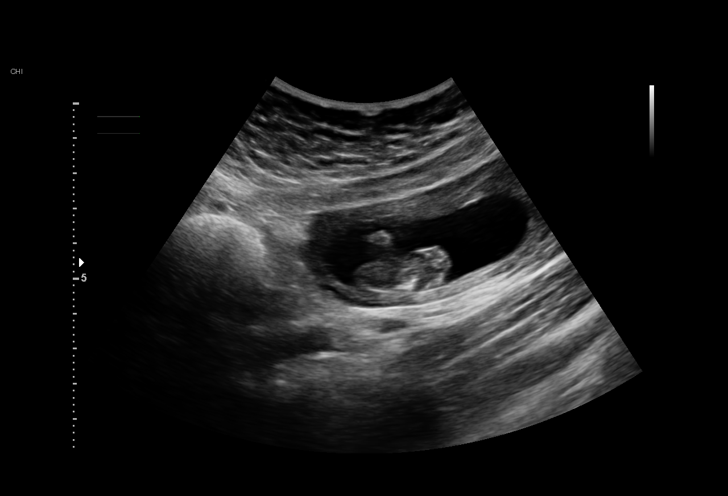

[Series 3: us ob comp +14 wk · 15 acquisitions, 2 frames shown (2 of 2)]
[im 1/15]
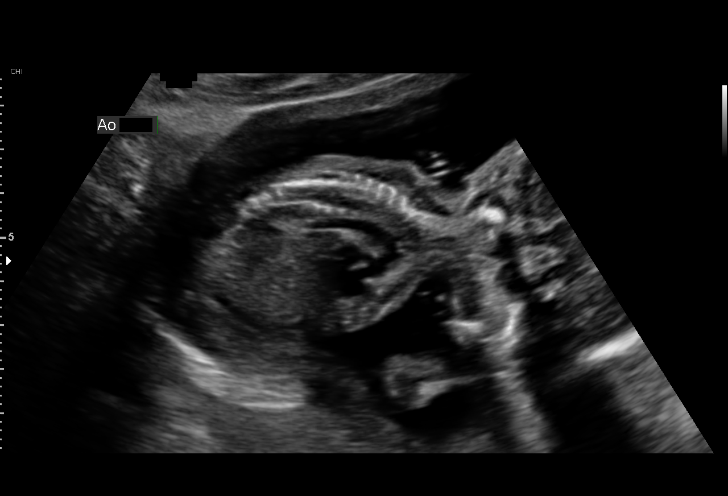
[im 10/15]
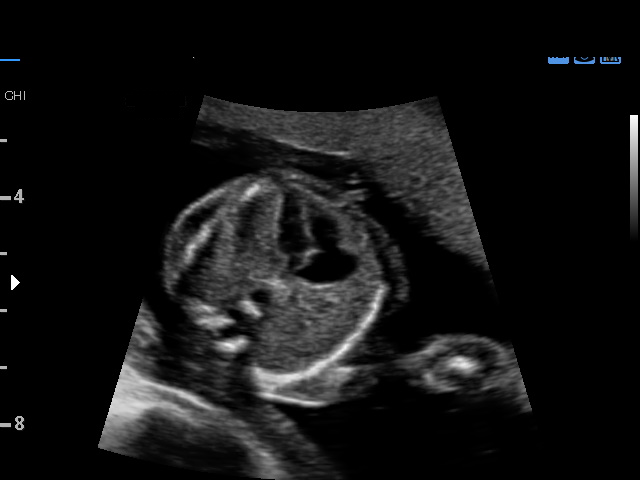

[13 of 28 positions shown; findings below may reference images not displayed]

OBSTETRICS REPORT
                      (Signed Final 09/25/2014 [DATE])

Service(s) Provided

 US OB COMP + 14 WK                                    76805.1
Indications

 19 weeks gestation of pregnancy
 Basic anatomic survey                                 [id number]
Fetal Evaluation

 Num Of Fetuses:    1
 Fetal Heart Rate:  150                          bpm
 Cardiac Activity:  Observed
 Presentation:      Cephalic
 Placenta:          Anterior, above cervical os
 P. Cord            Visualized, central
 Insertion:

 Amniotic Fluid
 AFI FV:      Subjectively within normal limits
                                             Larg Pckt:    3.68  cm
Biometry

 BPD:     41.6  mm     G. Age:  18w 4d                CI:         77.0   70 - 86
 OFD:       54  mm                                    FL/HC:      18.5   16.1 -

 HC:     153.2  mm     G. Age:  18w 2d       10  %    HC/AC:      1.15   1.09 -

 AC:       133  mm     G. Age:  18w 6d       35  %    FL/BPD:
 FL:      28.3  mm     G. Age:  18w 5d       27  %    FL/AC:      21.3   20 - 24
 HUM:     24.8  mm     G. Age:  17w 6d       11  %
 CER:     16.2  mm     G. Age:  16w 1d      < 5  %

 Est. FW:     253  gm      0 lb 9 oz     37  %
Gestational Age

 LMP:           19w 1d        Date:  05/14/14                 EDD:   02/18/15
 U/S Today:     18w 4d                                        EDD:   02/22/15
 Best:          19w 1d     Det. By:  LMP  (05/14/14)          EDD:   02/18/15
Anatomy

 Cranium:          Appears normal         Aortic Arch:      Appears normal
 Fetal Cavum:      Appears normal         Ductal Arch:      Appears normal
 Ventricles:       Appears normal         Diaphragm:        Appears normal
 Choroid Plexus:   Appears normal         Stomach:          Appears normal, left
                                                            sided
 Cerebellum:       Appears normal         Abdomen:          Appears normal
 Posterior Fossa:  Appears normal         Abdominal Wall:   Appears nml (cord
                                                            insert, abd wall)
 Nuchal Fold:      Appears normal         Cord Vessels:     Appears normal (3
                                                            vessel cord)
 Face:             Orbits appear          Kidneys:          Appear normal
                   normal
 Lips:             Appears normal         Bladder:          Appears normal
 Heart:            Appears normal         Spine:            Appears normal
                   (4CH, axis, and
                   situs)
 RVOT:             Appears normal         Lower             Appears normal
                                          Extremities:
 LVOT:             Appears normal         Upper             Appears normal
                                          Extremities:
Cervix Uterus Adnexa

 Cervical Length:    3.2      cm

 Cervix:       Normal appearance by transabdominal scan.
 Uterus:       No abnormality visualized.

 Left Ovary:    Not visualized.
 Right Ovary:   Not visualized.
 Adnexa:     No abnormality visualized.
Comments

 The patient's fetal anatomic survey is now complete.  No fetal
 anomalies or soft markers of aneuploidy were seen.
Impression

 Single living intrauterine pregnancy at 19 weeks 1 day.
 Appropriate fetal growth (37%).
 Normal amniotic fluid volume.
 Normal fetal anatomy.
 No fetal anomalies or soft markers of aneuploidy seen.
Recommendations

 Follow-up ultrasounds as clinically indicated.

                Fermin, Young

## 2016-05-09 ENCOUNTER — Encounter: Payer: Self-pay | Admitting: Obstetrics and Gynecology

## 2016-05-09 ENCOUNTER — Ambulatory Visit (INDEPENDENT_AMBULATORY_CARE_PROVIDER_SITE_OTHER): Payer: Medicaid Other | Admitting: Obstetrics and Gynecology

## 2016-05-09 VITALS — BP 121/79 | HR 70 | Ht 67.0 in | Wt 159.0 lb

## 2016-05-09 DIAGNOSIS — Z30430 Encounter for insertion of intrauterine contraceptive device: Secondary | ICD-10-CM

## 2016-05-09 DIAGNOSIS — Z01812 Encounter for preprocedural laboratory examination: Secondary | ICD-10-CM | POA: Diagnosis not present

## 2016-05-09 LAB — POCT URINE PREGNANCY: Preg Test, Ur: NEGATIVE

## 2016-05-09 NOTE — Progress Notes (Signed)
Wants to switch to a LARC.  Had Mirena in the past prior to pregnancy and her body rejected it.  She would like to try again if this is an option.  Currently using the Nuvaring.

## 2016-05-09 NOTE — Progress Notes (Signed)
Patient ID: Julia Wolfe, female   DOB: Dec 04, 1980, 35 y.o.   MRN: [id number] 35 yo G1P1 presenting today for IUD insertion. Patient has used Mirena IUD prior to pregnancy but had it removed due to malposition. Patient used Paraguard IUD for contraception postpartum but had it removed after one year secondary to heavier menses. She is currently using the NuvaRing but desires a longer reversible method  Past Medical History  Diagnosis Date  . Ovarian cyst   . Kidney stone    Past Surgical History  Procedure Laterality Date  . Tonsillectomy    . Ovarian cyst removal    . Kidney stone surgery    . Foot surgery    . Lasik    . Foot surgery Left 2008-2009   Family History  Problem Relation Age of Onset  . Cancer Maternal Aunt     breast  . Cancer Maternal Grandmother     colon  . Cancer Maternal Grandfather     lung  . Cancer Paternal Grandmother     bladder   Social History  Substance Use Topics  . Smoking status: Never Smoker   . Smokeless tobacco: Never Used  . Alcohol Use: No   ROS See pertinent in HPI  Blood pressure 121/79, pulse 70, height 5\' 7"  (1.702 m), weight 159 lb (72.122 kg), last menstrual period 04/18/2016, not currently breastfeeding. GENERAL: Well-developed, well-nourished female in no acute distress.  ABDOMEN: Soft, nontender, nondistended. No organomegaly. PELVIC: Normal external female genitalia. Vagina is pink and rugated.  Normal discharge. Normal appearing cervix. Uterus is normal in size. No adnexal mass or tenderness. EXTREMITIES: No cyanosis, clubbing, or edema, 2+ distal pulses.  A/P 35 yo here for IUD insertion IUD Procedure Note Patient identified, informed consent performed, signed copy in chart, time out was performed.  Urine pregnancy test negative.  Speculum placed in the vagina.  Cervix visualized.  Cleaned with Betadine x 2.  Grasped anteriorly with a single tooth tenaculum.  Uterus sounded to 8 cm. Liletta IUD placed per  manufacturer's recommendations.  Strings trimmed to 3 cm. Tenaculum was removed, good hemostasis noted.  Patient tolerated procedure well.   Patient given post procedure instructions and Liletta care card with expiration date.  Patient is asked to check IUD strings periodically and follow up in 4-6 weeks for IUD check.

## 2016-06-07 ENCOUNTER — Ambulatory Visit (INDEPENDENT_AMBULATORY_CARE_PROVIDER_SITE_OTHER): Payer: Medicaid Other | Admitting: Obstetrics & Gynecology

## 2016-06-07 ENCOUNTER — Encounter: Payer: Self-pay | Admitting: Obstetrics & Gynecology

## 2016-06-07 VITALS — BP 116/65 | HR 71 | Resp 16 | Wt 162.0 lb

## 2016-06-07 DIAGNOSIS — Z30431 Encounter for routine checking of intrauterine contraceptive device: Secondary | ICD-10-CM | POA: Diagnosis not present

## 2016-06-07 NOTE — Progress Notes (Signed)
   Subjective:    Patient ID: Julia Wolfe, female    DOB: 1981-01-30, 35 y.o.   MRN: [id number]  HPI 35 yo P1 here for a string check. She has had the Mirena in for about 4 weeks. The first 2 weeks after insertion she had some bleeding but that has resolved. She has cramping, about 2 times per week she has to take 600 mg IBU.   Review of Systems Pap normal about 2 years ago.    Objective:   Physical Exam [id number] Breathing, conversing, and ambulating normally Abd- benign Cervix visible with Mirena strings visible       Assessment & Plan:  Contraception- Mirena RTC 1 year for annual/prn sooner

## 2016-12-20 ENCOUNTER — Encounter (HOSPITAL_COMMUNITY): Payer: Self-pay | Admitting: *Deleted

## 2016-12-20 DIAGNOSIS — N132 Hydronephrosis with renal and ureteral calculous obstruction: Secondary | ICD-10-CM | POA: Insufficient documentation

## 2016-12-20 DIAGNOSIS — R109 Unspecified abdominal pain: Secondary | ICD-10-CM | POA: Diagnosis present

## 2016-12-20 MED ORDER — OXYCODONE-ACETAMINOPHEN 5-325 MG PO TABS
1.0000 | ORAL_TABLET | ORAL | Status: DC | PRN
  Administered 2016-12-20: 1 via ORAL

## 2016-12-20 MED ORDER — ONDANSETRON 4 MG PO TBDP
ORAL_TABLET | ORAL | Status: AC
  Filled 2016-12-20: qty 1

## 2016-12-20 MED ORDER — ONDANSETRON 4 MG PO TBDP
4.0000 mg | ORAL_TABLET | Freq: Once | ORAL | Status: AC
Start: 2016-12-20 — End: 2016-12-20
  Administered 2016-12-20: 4 mg via ORAL

## 2016-12-20 MED ORDER — OXYCODONE-ACETAMINOPHEN 5-325 MG PO TABS
ORAL_TABLET | ORAL | Status: AC
  Filled 2016-12-20: qty 1

## 2016-12-20 NOTE — ED Triage Notes (Signed)
Pt c/o R flank pain onset a couple of hours ago, reports urinary frequency. Was diagnosed with 4mm kidney stone in November.

## 2016-12-21 ENCOUNTER — Emergency Department (HOSPITAL_COMMUNITY): Payer: Medicaid Other

## 2016-12-21 ENCOUNTER — Emergency Department (HOSPITAL_COMMUNITY)
Admission: EM | Admit: 2016-12-21 | Discharge: 2016-12-21 | Disposition: A | Discharge status: Home / Self Care | Payer: Medicaid Other | Attending: Emergency Medicine | Admitting: Emergency Medicine

## 2016-12-21 DIAGNOSIS — N200 Calculus of kidney: Secondary | ICD-10-CM

## 2016-12-21 LAB — COMPREHENSIVE METABOLIC PANEL
ALT: 19 U/L (ref 14–54)
AST: 19 U/L (ref 15–41)
Albumin: 4.7 g/dL (ref 3.5–5.0)
Alkaline Phosphatase: 70 U/L (ref 38–126)
Anion gap: 11 (ref 5–15)
BILIRUBIN TOTAL: 0.5 mg/dL (ref 0.3–1.2)
BUN: 18 mg/dL (ref 6–20)
CHLORIDE: 105 mmol/L (ref 101–111)
CO2: 20 mmol/L — ABNORMAL LOW (ref 22–32)
Calcium: 9.8 mg/dL (ref 8.9–10.3)
Creatinine, Ser: 0.97 mg/dL (ref 0.44–1.00)
GLUCOSE: 89 mg/dL (ref 65–99)
Potassium: 3.4 mmol/L — ABNORMAL LOW (ref 3.5–5.1)
Sodium: 136 mmol/L (ref 135–145)
Total Protein: 7.9 g/dL (ref 6.5–8.1)

## 2016-12-21 LAB — URINALYSIS, ROUTINE W REFLEX MICROSCOPIC
BILIRUBIN URINE: NEGATIVE
Glucose, UA: NEGATIVE mg/dL
Ketones, ur: 5 mg/dL — AB
NITRITE: NEGATIVE
Protein, ur: NEGATIVE mg/dL
SPECIFIC GRAVITY, URINE: 1.021 (ref 1.005–1.030)
pH: 6 (ref 5.0–8.0)

## 2016-12-21 LAB — CBC WITH DIFFERENTIAL/PLATELET
BASOS ABS: 0 10*3/uL (ref 0.0–0.1)
Basophils Relative: 0 %
EOS PCT: 2 %
Eosinophils Absolute: 0.1 10*3/uL (ref 0.0–0.7)
HEMATOCRIT: 40.5 % (ref 36.0–46.0)
Hemoglobin: 13.7 g/dL (ref 12.0–15.0)
LYMPHS ABS: 3.8 10*3/uL (ref 0.7–4.0)
LYMPHS PCT: 47 %
MCH: 29.2 pg (ref 26.0–34.0)
MCHC: 33.8 g/dL (ref 30.0–36.0)
MCV: 86.4 fL (ref 78.0–100.0)
MONO ABS: 0.7 10*3/uL (ref 0.1–1.0)
Monocytes Relative: 8 %
NEUTROS ABS: 3.5 10*3/uL (ref 1.7–7.7)
Neutrophils Relative %: 43 %
PLATELETS: 321 10*3/uL (ref 150–400)
RBC: 4.69 MIL/uL (ref 3.87–5.11)
RDW: 13.3 % (ref 11.5–15.5)
WBC: 8.2 10*3/uL (ref 4.0–10.5)

## 2016-12-21 LAB — POC URINE PREG, ED: Preg Test, Ur: NEGATIVE

## 2016-12-21 MED ORDER — TAMSULOSIN HCL 0.4 MG PO CAPS: 0.4000 mg | ORAL_CAPSULE | Freq: Every day | ORAL | 0 refills | Status: DC

## 2016-12-21 MED ORDER — DEXTROSE 5 % IV SOLN
1.0000 g | Freq: Once | INTRAVENOUS | Status: AC
  Administered 2016-12-21: 1 g via INTRAVENOUS
  Filled 2016-12-21: qty 10

## 2016-12-21 MED ORDER — SODIUM CHLORIDE 0.9 % IV BOLUS (SEPSIS)
1000.0000 mL | Freq: Once | INTRAVENOUS | Status: AC
  Administered 2016-12-21: 1000 mL via INTRAVENOUS

## 2016-12-21 MED ORDER — ONDANSETRON HCL 4 MG/2ML IJ SOLN
4.0000 mg | Freq: Once | INTRAMUSCULAR | Status: AC
  Administered 2016-12-21: 4 mg via INTRAVENOUS
  Filled 2016-12-21: qty 2

## 2016-12-21 MED ORDER — OXYCODONE-ACETAMINOPHEN 5-325 MG PO TABS: 1.0000 | ORAL_TABLET | Freq: Four times a day (QID) | ORAL | 0 refills | Status: DC | PRN

## 2016-12-21 MED ORDER — MORPHINE SULFATE (PF) 4 MG/ML IV SOLN
4.0000 mg | Freq: Once | INTRAVENOUS | Status: AC
  Administered 2016-12-21: 4 mg via INTRAVENOUS
  Filled 2016-12-21: qty 1

## 2016-12-21 MED ORDER — KETOROLAC TROMETHAMINE 30 MG/ML IJ SOLN
30.0000 mg | Freq: Once | INTRAMUSCULAR | Status: AC
  Administered 2016-12-21: 30 mg via INTRAVENOUS
  Filled 2016-12-21: qty 1

## 2016-12-21 MED ORDER — ONDANSETRON 4 MG PO TBDP: 4.0000 mg | ORAL_TABLET | Freq: Three times a day (TID) | ORAL | 0 refills | Status: DC | PRN

## 2016-12-21 MED ORDER — IBUPROFEN 800 MG PO TABS: 800.0000 mg | ORAL_TABLET | Freq: Three times a day (TID) | ORAL | 0 refills | Status: DC | PRN

## 2016-12-21 NOTE — ED Provider Notes (Signed)
By signing my name below, I, Talbert Nanaul Grant, attest that this documentation has been prepared under the direction and in the presence of Moesha Sarchet N Aletta Edmunds, DO. Electronically Signed: Talbert NanPaul Grant, Scribe. 12/21/16. 1:34 AM.  TIME SEEN: 1:26 AM   CHIEF COMPLAINT: flank pain  HPI: Julia Wolfe is a 36 y.o. female with h/o kidney stones and an ovarian cyst who presents to the Emergency Department complaining of moderate-severe, constant right-sided flank pain. She has associated back pain, dysuria, urinary frequency, nausea. She believes that she is passing a kidney stone and a urinary infection. Patient reports that in November she had a 4mm kidney stone was found on her right side on CT scan at outside hospital. She has stone retrieval and ureteral stent [place] previously [place]. She denies vomiting. She has not had a kidney stone for the last 6 years. States pain started getting bad again a week ago and she began having urinary symptoms and thought she could have a UTI. Began taking leftover doses of Keflex that she had at home.   ROS: See HPI Constitutional: no fever  Eyes: no drainage  ENT: no runny nose   Cardiovascular:  no chest pain  Resp: no SOB  GI: no vomiting GU: dysuria Integumentary: no rash  Allergy: no hives  Musculoskeletal: no leg swelling  Neurological: no slurred speech ROS otherwise negative  PAST MEDICAL HISTORY/PAST SURGICAL HISTORY:  Past Medical History:  Diagnosis Date  . Kidney stone   . Ovarian cyst     MEDICATIONS:  Prior to Admission medications   Not on File    ALLERGIES:  No Known Allergies  SOCIAL HISTORY:  Social History  Substance Use Topics  . Smoking status: Never Smoker  . Smokeless tobacco: Never Used  . Alcohol use No    FAMILY HISTORY: Family History  Problem Relation Age of Onset  . Cancer Maternal Aunt     breast  . Cancer Maternal Grandmother     colon  . Cancer Maternal Grandfather     lung   . Cancer Paternal Grandmother     bladder    EXAM: BP 121/75 (BP Location: Right Arm)   Pulse 74   Temp 97.7 F (36.5 C) (Oral)   Resp 16   LMP 12/03/2016   SpO2 100%  CONSTITUTIONAL: Alert and oriented and responds appropriately to questions. Well-appearing; well-nourished, appears uncomfortable HEAD: Normocephalic EYES: Conjunctivae clear, PERRL, EOMI ENT: normal nose; no rhinorrhea; moist mucous membranes NECK: Supple, no meningismus, no nuchal rigidity, no LAD  CARD: RRR; S1 and S2 appreciated; no murmurs, no clicks, no rubs, no gallops RESP: Normal chest excursion without splinting or tachypnea; breath sounds clear and equal bilaterally; no wheezes, no rhonchi, no rales, no hypoxia or respiratory distress, speaking full sentences ABD/GI: Normal bowel sounds; non-distended; soft, non-tender, no rebound, no guarding, no peritoneal signs, no hepatosplenomegaly BACK:  The back appears normal; there is right CVA tenderness, no midline spinal tenderness or step-off or deformity EXT: Normal ROM in all joints; non-tender to palpation; no edema; normal capillary refill; no cyanosis, no calf tenderness or swelling    SKIN: Normal color for age and race; warm; no rash NEURO: Moves all extremities equally, sensation to light touch intact diffusely, cranial nerves II through XII intact, normal speech PSYCH: The patient's mood and manner are appropriate. Grooming and personal hygiene are appropriate.  MEDICAL DECISION MAKING: Patient here with symptoms of kidney stone. Urine shows blood but also too numerous  to count white blood cells and rare bacteria. Could be partially treated urinary tract infection, pyelonephritis or kidney stone. We'll obtain labs and repeat imaging given pain has been intermittent, fluctuating since November. Will treat with morphine, Toradol, Zofran and IV fluids. She is not pregnant.  ED PROGRESS: Patient's labs are unremarkable. Urine culture is pending. CT scan shows  obstructing 4 x 5 right UVJ calculus with market hydronephrosis. Discussed with Dr. Laverle Patter on call for urology. We appreciate his help. He has reviewed patient's results. He thinks that her urinalysis findings are typical for kidney stone and does not recommend further treatment with antibiotics. We'll discharge with urine strainer, pain and nausea medication and outpatient urology follow-up. Symptoms have been well controlled after Toradol, morphine 2 and Zofran. Have discussed this plan with patient and she is comfortable with this plan. Discussed return precautions.    At this time, I do not feel there is any life-threatening condition present. I have reviewed and discussed all results (EKG, imaging, lab, urine as appropriate) and exam findings with patient/family. I have reviewed nursing notes and appropriate previous records.  I feel the patient is safe to be discharged home without further emergent workup and can continue workup as an outpatient as needed. Discussed usual and customary return precautions. Patient/family verbalize understanding and are comfortable with this plan.  Outpatient follow-up has been provided. All questions have been answered.   I personally performed the services described in this documentation, which was scribed in my presence. The recorded information has been reviewed and is accurate.     Layla Maw Sundance Moise, DO 12/21/16 [phone]

## 2016-12-22 LAB — URINE CULTURE: Culture: NO GROWTH

## 2017-11-14 DIAGNOSIS — J069 Acute upper respiratory infection, unspecified: Secondary | ICD-10-CM | POA: Insufficient documentation

## 2018-11-26 ENCOUNTER — Encounter (HOSPITAL_COMMUNITY): Payer: Self-pay | Admitting: Emergency Medicine

## 2018-11-26 ENCOUNTER — Ambulatory Visit (HOSPITAL_COMMUNITY)
Admission: EM | Admit: 2018-11-26 | Discharge: 2018-11-26 | Disposition: A | Discharge status: Home / Self Care | Payer: Medicaid Other | Attending: Emergency Medicine | Admitting: Emergency Medicine

## 2018-11-26 ENCOUNTER — Other Ambulatory Visit: Payer: Self-pay

## 2018-11-26 DIAGNOSIS — J019 Acute sinusitis, unspecified: Secondary | ICD-10-CM | POA: Diagnosis not present

## 2018-11-26 DIAGNOSIS — J209 Acute bronchitis, unspecified: Secondary | ICD-10-CM | POA: Insufficient documentation

## 2018-11-26 MED ORDER — PREDNISONE 50 MG PO TABS: 50.0000 mg | ORAL_TABLET | Freq: Every day | ORAL | 0 refills | Status: AC

## 2018-11-26 MED ORDER — AMOXICILLIN-POT CLAVULANATE 875-125 MG PO TABS: 1.0000 | ORAL_TABLET | Freq: Two times a day (BID) | ORAL | 0 refills | Status: AC

## 2018-11-26 MED ORDER — PSEUDOEPH-BROMPHEN-DM 30-2-10 MG/5ML PO SYRP: 5.0000 mL | ORAL_SOLUTION | Freq: Four times a day (QID) | ORAL | 0 refills | Status: DC | PRN

## 2018-11-26 MED ORDER — FLUTICASONE PROPIONATE 50 MCG/ACT NA SUSP: 1.0000 | Freq: Every day | NASAL | 0 refills | Status: DC

## 2018-11-26 NOTE — Discharge Instructions (Signed)
We are treating for sinusitis and continuing bronchitis treatment Begin Augmentin twice daily for 10 days Prednisone daily for 5 days, with food Albuterol inhaler as needed for shortness of breath, wheezing Begin daily cetirizine/zyrtec- you can get generic over the counter Flonase nasal spray 1-2 sprays daily Cough syrup as needed Continue to monitor

## 2018-11-26 NOTE — ED Triage Notes (Signed)
PT had pneumonia over christmas and her chest still hurts.   PT reports bilateral ear pain for a month. PT reports dizziness that she believes is associated with it. PT had multiple flights Saturday. She fainted in the airport for a few moments and then continued with her day.  PT reports dizziness when blowing her nose.

## 2018-11-27 NOTE — ED Provider Notes (Signed)
[facility]    CSN: [id number] Arrival date & time: 11/26/18  1709     History   Chief Complaint Chief Complaint  Patient presents with  . Otalgia    HPI Julia Wolfe is a 38 y.o. female no significant past medical history presenting today for evaluation of persistent cough and congestion.  Patient states that for the past month she has had sinus congestion, nasal drainage, cough.  She traveled to [place] for Christmas and had chest discomfort.  She was seen there and had chest x-ray showing peribronchial thickening.  She was treated for bronchitis with azithromycin, albuterol and prednisone.  She finished these medicines and has not had much improvement.  She continues to have pressure over her sinuses.  She also notes that while she was traveling back to [place] via airplane, she was in the airport and had an episode of dizziness and fainted.  Since she has not had recurrent issues with this.  She denies any headache, vision changes or weakness.  He has noticed dizziness when she blows her nose.  She feels as if her equilibrium has been off that she is also had ear pain.  HPI  Past Medical History:  Diagnosis Date  . Kidney stone   . Ovarian cyst     There are no active problems to display for this patient.   Past Surgical History:  Procedure Laterality Date  . FOOT SURGERY    . FOOT SURGERY Left 2008-2009  . KIDNEY STONE SURGERY    . LASIK    . OVARIAN CYST REMOVAL    . TONSILLECTOMY      OB History    Gravida  1   Para  1   Term  1   Preterm  0   AB  0   Living  1     SAB  0   TAB  0   Ectopic  0   Multiple  0   Live Births  1            Home Medications    Prior to Admission medications   Medication Sig Start Date End Date Taking? Authorizing Provider  amoxicillin-clavulanate (AUGMENTIN) 875-125 MG tablet Take 1 tablet by mouth every 12 (twelve) hours for 10 days. 11/26/18 12/06/18  Farley Crooker C, PA-C    brompheniramine-pseudoephedrine-DM 30-2-10 MG/5ML syrup Take 5 mLs by mouth 4 (four) times daily as needed. 11/26/18   Matayah Reyburn C, PA-C  fluticasone (FLONASE) 50 MCG/ACT nasal spray Place 1-2 sprays into both nostrils daily for 7 days. 11/26/18 12/03/18  Floriene Jeschke C, PA-C  predniSONE (DELTASONE) 50 MG tablet Take 1 tablet (50 mg total) by mouth daily for 5 days. 11/26/18 12/01/18  Dylin Ihnen, Junius CreamerHallie C, PA-C    Family History Family History  Problem Relation Age of Onset  . Cancer Maternal Aunt        breast  . Cancer Maternal Grandmother        colon  . Cancer Maternal Grandfather        lung  . Cancer Paternal Grandmother        bladder    Social History Social History   Tobacco Use  . Smoking status: Never Smoker  . Smokeless tobacco: Never Used  Substance Use Topics  . Alcohol use: No    Alcohol/week: 0.0 standard drinks  . Drug use: No     Allergies   Patient has no known allergies.   Review of Systems  Review of Systems  Constitutional: Positive for fatigue. Negative for activity change, appetite change, chills and fever.  HENT: Positive for congestion, ear pain, rhinorrhea, sinus pressure and sore throat. Negative for trouble swallowing.   Eyes: Negative for discharge and redness.  Respiratory: Positive for cough. Negative for chest tightness and shortness of breath.   Cardiovascular: Negative for chest pain.  Gastrointestinal: Negative for abdominal pain, diarrhea, nausea and vomiting.  Musculoskeletal: Negative for myalgias.  Skin: Negative for rash.  Neurological: Positive for dizziness and light-headedness. Negative for headaches.     Physical Exam Triage Vital Signs ED Triage Vitals  Enc Vitals Group     BP 11/26/18 1757 128/79     Pulse Rate 11/26/18 1757 71     Resp 11/26/18 1757 16     Temp 11/26/18 1757 98.3 F (36.8 C)     Temp Source 11/26/18 1757 Oral     SpO2 11/26/18 1757 99 %     Weight --      Height --      Head Circumference --       Peak Flow --      Pain Score 11/26/18 1755 4     Pain Loc --      Pain Edu? --      Excl. in GC? --    No data found.  Updated Vital Signs BP 128/79   Pulse 71   Temp 98.3 F (36.8 C) (Oral)   Resp 16   SpO2 99%   Visual Acuity Right Eye Distance:   Left Eye Distance:   Bilateral Distance:    Right Eye Near:   Left Eye Near:    Bilateral Near:     Physical Exam Vitals signs and nursing note reviewed.  Constitutional:      General: She is not in acute distress.    Appearance: She is well-developed.  HENT:     Head: Normocephalic and atraumatic.     Ears:     Comments: Bilateral ears without tenderness to palpation of external auricle, tragus and mastoid, EAC's without erythema or swelling, TM's with good bony landmarks and cone of light. Non erythematous.    Nose:     Comments: Nasal mucosa erythematous, nonswollen turbinates    Mouth/Throat:     Comments: Oral mucosa pink and moist, no tonsillar enlargement or exudate. Posterior pharynx patent and nonerythematous, no uvula deviation or swelling.  Sounds congested Eyes:     Conjunctiva/sclera: Conjunctivae normal.  Neck:     Musculoskeletal: Neck supple.  Cardiovascular:     Rate and Rhythm: Normal rate and regular rhythm.     Heart sounds: No murmur.  Pulmonary:     Effort: Pulmonary effort is normal. No respiratory distress.     Breath sounds: Normal breath sounds.     Comments: Breathing comfortably at rest, CTABL,  Faint expiratory wheezing throughout bilateral lung fields Abdominal:     Palpations: Abdomen is soft.     Tenderness: There is no abdominal tenderness.  Skin:    General: Skin is warm and dry.  Neurological:     General: No focal deficit present.     Mental Status: She is alert and oriented to person, place, and time.      UC Treatments / Results  Labs (all labs ordered are listed, but only abnormal results are displayed) Labs Reviewed - No data to  display  EKG None  Radiology No results found.  Procedures Procedures (including critical care time)  Medications Ordered in UC Medications - No data to display  Initial Impression / Assessment and Plan / UC Course  I have reviewed the triage vital signs and the nursing notes.  Pertinent labs & imaging results that were available during my care of the patient were reviewed by me and considered in my medical decision making (see chart for details).     Vital signs stable, exam nonfocal, will treat for sinusitis and bronchitis.  Will provide Augmentin as alternative, another course of prednisone as well as recommended increasing use of albuterol inhaler.  Zyrtec and Flonase for congestion and ear discomfort.  Continue to monitor dizziness and lightheadedness, push fluids, follow-up in emergency room if having another episode that leads to syncope.  Symptoms seem most likely sinusitis/bronchitis, but discussed with patient given her episode of fainting and dizziness if symptoms not resolving or worsening she should go to the emergency room for further evaluation of PE.  Her only risk factor at this time is recent travel.Discussed strict return precautions. Patient verbalized understanding and is agreeable with plan.  Final Clinical Impressions(s) / UC Diagnoses   Final diagnoses:  Acute sinusitis with symptoms > 10 days  Acute bronchitis, unspecified organism     Discharge Instructions     We are treating for sinusitis and continuing bronchitis treatment Begin Augmentin twice daily for 10 days Prednisone daily for 5 days, with food Albuterol inhaler as needed for shortness of breath, wheezing Begin daily cetirizine/zyrtec- you can get generic over the counter Flonase nasal spray 1-2 sprays daily Cough syrup as needed Continue to monitor   ED Prescriptions    Medication Sig Dispense Auth. Provider   amoxicillin-clavulanate (AUGMENTIN) 875-125 MG tablet Take 1 tablet by mouth  every 12 (twelve) hours for 10 days. 20 tablet Geffrey Michaelsen C, PA-C   predniSONE (DELTASONE) 50 MG tablet Take 1 tablet (50 mg total) by mouth daily for 5 days. 5 tablet Kadisha Goodine C, PA-C   fluticasone (FLONASE) 50 MCG/ACT nasal spray Place 1-2 sprays into both nostrils daily for 7 days. 1 g Enrico Eaddy C, PA-C   brompheniramine-pseudoephedrine-DM 30-2-10 MG/5ML syrup Take 5 mLs by mouth 4 (four) times daily as needed. 120 mL Tiffane Sheldon C, PA-C     Controlled Substance Prescriptions [place] Controlled Substance Registry consulted? Not Applicable   Lew Dawes, [place] 11/27/18 [id number]

## 2019-05-08 ENCOUNTER — Other Ambulatory Visit: Payer: Self-pay

## 2019-05-08 ENCOUNTER — Encounter: Payer: Self-pay | Admitting: Podiatry

## 2019-05-08 ENCOUNTER — Ambulatory Visit (INDEPENDENT_AMBULATORY_CARE_PROVIDER_SITE_OTHER): Payer: BC Managed Care – PPO | Admitting: Podiatry

## 2019-05-08 VITALS — BP 135/84 | HR 69 | Temp 98.1°F

## 2019-05-08 DIAGNOSIS — L600 Ingrowing nail: Secondary | ICD-10-CM | POA: Diagnosis not present

## 2019-05-08 MED ORDER — NEOMYCIN-POLYMYXIN-HC 3.5-10000-1 OT SOLN: OTIC | 0 refills | Status: DC

## 2019-05-08 NOTE — Progress Notes (Signed)
Subjective:   Patient ID: Julia Wolfe, female   DOB: 38 y.o.   MRN: [id number]   HPI Patient states she is had a chronic ingrown toenail of her right big toe and it has been sore for around 6 months and she is tried different treatment options including soaks and trimming.  Patient also had a fibular sesamoid taken out left and did discuss that with me.  Patient does not smoke likes to be active   Review of Systems  All other systems reviewed and are negative.       Objective:  Physical Exam Vitals signs and nursing note reviewed.  Constitutional:      Appearance: She is well-developed.  Pulmonary:     Effort: Pulmonary effort is normal.  Musculoskeletal: Normal range of motion.  Skin:    General: Skin is warm.  Neurological:     Mental Status: She is alert.     Neurovascular status found to be intact muscle strength is adequate range of motion within normal limits.  Patient is noted to have incurvated right hallux lateral border that is painful when pressed and make shoe gear difficult with no active drainage or redness.  Patient has good digital perfusion well oriented x3 and has an incision bottom the left first metatarsal from previous fibular sesamoidectomy     Assessment:  Ingrown toenail deformity right hallux lateral border with pain     Plan:  H&P condition reviewed with patient and at this time discussed correction of toenail educating her on this and giving her consent form to review and signed understanding risk.  Today I infiltrated the right hallux 60 mg like Marcaine mixture with sterile instrumentation I remove the lateral border exposed matrix and applied phenol 3 applications 30 seconds followed by alcohol lavage and sterile dressing.  Gave instructions for soaks and applied dressing and instructed her to leave this on 24 hours but take it off earlier if it should start to throb and wrote her prescription for drops with instructions on usage and encouraged  to call with questions concerns

## 2019-05-08 NOTE — Patient Instructions (Signed)

## 2019-05-09 ENCOUNTER — Ambulatory Visit: Payer: Self-pay | Admitting: Podiatry

## 2019-05-22 ENCOUNTER — Ambulatory Visit (INDEPENDENT_AMBULATORY_CARE_PROVIDER_SITE_OTHER): Payer: BC Managed Care – PPO | Admitting: Podiatry

## 2019-05-22 ENCOUNTER — Other Ambulatory Visit: Payer: Self-pay

## 2019-05-22 ENCOUNTER — Encounter: Payer: Self-pay | Admitting: Podiatry

## 2019-05-22 VITALS — Temp 98.6°F

## 2019-05-22 DIAGNOSIS — L600 Ingrowing nail: Secondary | ICD-10-CM

## 2019-05-22 NOTE — Progress Notes (Signed)
Subjective:   Patient ID: Julia Wolfe, female   DOB: 38 y.o.   MRN: [id number]   HPI Patient presents stating nails healing well with some crusted tissue and I just wanted to get it checked   ROS      Objective:  Physical Exam  Neurovascular status intact with crusted nail condition right hallux lateral border with no pain or proximal edema erythema     Assessment:  Normal tissue formation      Plan:  Advised on finishing soaks and bandage usage and if any increased redness or drainage were to occur to let us know immediately

## 2019-08-18 ENCOUNTER — Telehealth: Payer: Self-pay | Admitting: *Deleted

## 2019-08-18 NOTE — Telephone Encounter (Signed)
I called pt and asked if she had a fever or red streaks from the toe. Pt states she has redness, tenderness and drainage but not fever or streaking. I told pt that we should get her in for an appt, to perform epsom salt 1/4 cup to 1 quart warm water at least once daily and apply antibiotic ointment bandaid to the toe until see in office. Transferred to scheduler.

## 2019-08-18 NOTE — Telephone Encounter (Signed)
Pt called states she had an ingrown toenail procedure 2 months ago, and toe began to appear infected 1 week ago and now has pus.

## 2019-08-22 ENCOUNTER — Encounter: Payer: Self-pay | Admitting: Podiatry

## 2019-08-22 ENCOUNTER — Other Ambulatory Visit: Payer: Self-pay

## 2019-08-22 ENCOUNTER — Ambulatory Visit (INDEPENDENT_AMBULATORY_CARE_PROVIDER_SITE_OTHER): Payer: BC Managed Care – PPO | Admitting: Podiatry

## 2019-08-22 DIAGNOSIS — L03031 Cellulitis of right toe: Secondary | ICD-10-CM | POA: Diagnosis not present

## 2019-08-22 NOTE — Patient Instructions (Signed)

## 2019-08-27 NOTE — Progress Notes (Signed)
Subjective:   Patient ID: Julia Wolfe, female   DOB: 38 y.o.   MRN: [id number]   HPI Patient states that recently she is started developed some drainage in the right big toe and the lateral side and states that it is been tender   ROS      Objective:  Physical Exam  Neurovascular status intact with inflammation drainage of the lateral side of the right hallux that is localized with no proximal edema erythema drainage noted     Assessment:  Paronychia infection right hallux lateral side     Plan:  H&P reviewed condition and recommended cleaning the area out with paronychia excision.  Patient wants procedure and today I infiltrated the right hallux 60 mg like Marcaine mixture sterile prep applied to the toe and using sterile instrumentation I removed the lateral border tissue I flushed out the bed and I instructed on soaks.  Patient will be seen back for [place] to recheck as needed and may require more aggressive procedure if it does not improve

## 2021-02-08 DIAGNOSIS — Z20822 Contact with and (suspected) exposure to covid-19: Secondary | ICD-10-CM | POA: Diagnosis not present

## 2021-02-08 DIAGNOSIS — Z03818 Encounter for observation for suspected exposure to other biological agents ruled out: Secondary | ICD-10-CM | POA: Diagnosis not present

## 2021-02-26 DIAGNOSIS — H66001 Acute suppurative otitis media without spontaneous rupture of ear drum, right ear: Secondary | ICD-10-CM | POA: Diagnosis not present

## 2021-04-07 DIAGNOSIS — H6980 Other specified disorders of Eustachian tube, unspecified ear: Secondary | ICD-10-CM | POA: Diagnosis not present

## 2021-07-06 DIAGNOSIS — L409 Psoriasis, unspecified: Secondary | ICD-10-CM | POA: Diagnosis not present

## 2021-09-23 DIAGNOSIS — M545 Low back pain, unspecified: Secondary | ICD-10-CM | POA: Diagnosis not present

## 2021-09-23 DIAGNOSIS — M4726 Other spondylosis with radiculopathy, lumbar region: Secondary | ICD-10-CM | POA: Diagnosis not present

## 2021-09-23 DIAGNOSIS — M5136 Other intervertebral disc degeneration, lumbar region: Secondary | ICD-10-CM | POA: Diagnosis not present

## 2021-09-23 DIAGNOSIS — M5126 Other intervertebral disc displacement, lumbar region: Secondary | ICD-10-CM | POA: Diagnosis not present

## 2021-12-06 ENCOUNTER — Encounter: Payer: Self-pay | Admitting: Obstetrics and Gynecology

## 2021-12-06 ENCOUNTER — Ambulatory Visit (INDEPENDENT_AMBULATORY_CARE_PROVIDER_SITE_OTHER): Payer: BC Managed Care – PPO | Admitting: Obstetrics and Gynecology

## 2021-12-06 ENCOUNTER — Other Ambulatory Visit: Payer: Self-pay

## 2021-12-06 VITALS — BP 123/89 | HR 67 | Ht 67.0 in | Wt 180.0 lb

## 2021-12-06 DIAGNOSIS — Z30432 Encounter for removal of intrauterine contraceptive device: Secondary | ICD-10-CM

## 2021-12-06 NOTE — Progress Notes (Signed)
Pt would like IUD removed- wants to get pregnant

## 2021-12-06 NOTE — Progress Notes (Signed)
IUD Removal   Patient was in the dorsal lithotomy position, normal external genitalia was noted.  A speculum was placed in the patient's vagina, normal discharge was noted, no lesions. The multiparous cervix was visualized, no lesions, no abnormal discharge.  The strings of the IUD were grasped and pulled using ring forceps. The IUD was removed in its entirety. Patient tolerated the procedure well.     Patient desires pregnancy. Prenatal vitamins recommended daily. She is due for annual with pap, she will schedule that today.    Venia Carbon I, NP. 12/06/2021 9:02 AM

## 2021-12-20 DIAGNOSIS — R635 Abnormal weight gain: Secondary | ICD-10-CM | POA: Diagnosis not present

## 2021-12-20 DIAGNOSIS — Z803 Family history of malignant neoplasm of breast: Secondary | ICD-10-CM | POA: Diagnosis not present

## 2021-12-20 DIAGNOSIS — Z0000 Encounter for general adult medical examination without abnormal findings: Secondary | ICD-10-CM | POA: Diagnosis not present

## 2021-12-26 DIAGNOSIS — R635 Abnormal weight gain: Secondary | ICD-10-CM | POA: Diagnosis not present

## 2021-12-26 DIAGNOSIS — Z13220 Encounter for screening for lipoid disorders: Secondary | ICD-10-CM | POA: Diagnosis not present

## 2022-01-03 ENCOUNTER — Ambulatory Visit (INDEPENDENT_AMBULATORY_CARE_PROVIDER_SITE_OTHER): Payer: BC Managed Care – PPO | Admitting: Obstetrics and Gynecology

## 2022-01-03 ENCOUNTER — Other Ambulatory Visit (HOSPITAL_COMMUNITY)
Admission: RE | Admit: 2022-01-03 | Discharge: 2022-01-03 | Disposition: A | Discharge status: Home / Self Care | Payer: BC Managed Care – PPO | Source: Ambulatory Visit | Attending: Obstetrics and Gynecology | Admitting: Obstetrics and Gynecology

## 2022-01-03 ENCOUNTER — Encounter: Payer: Self-pay | Admitting: Obstetrics and Gynecology

## 2022-01-03 ENCOUNTER — Other Ambulatory Visit: Payer: Self-pay

## 2022-01-03 VITALS — BP 129/79 | HR 69 | Resp 16 | Ht 67.0 in | Wt 174.0 lb

## 2022-01-03 DIAGNOSIS — Z01812 Encounter for preprocedural laboratory examination: Secondary | ICD-10-CM | POA: Diagnosis not present

## 2022-01-03 DIAGNOSIS — Z30430 Encounter for insertion of intrauterine contraceptive device: Secondary | ICD-10-CM | POA: Diagnosis not present

## 2022-01-03 DIAGNOSIS — Z01419 Encounter for gynecological examination (general) (routine) without abnormal findings: Secondary | ICD-10-CM | POA: Insufficient documentation

## 2022-01-03 LAB — POCT URINE PREGNANCY: Preg Test, Ur: NEGATIVE

## 2022-01-03 MED ORDER — LEVONORGESTREL 19.5 MG IU IUD
INTRAUTERINE_SYSTEM | Freq: Once | INTRAUTERINE | Status: AC
Start: 2022-01-03 — End: 2022-01-03

## 2022-01-03 NOTE — Progress Notes (Signed)
GYNECOLOGY ANNUAL PREVENTATIVE CARE ENCOUNTER NOTE  History:     Amariona Rathje is a 41 y.o. G16P1001 female here for a routine annual gynecologic exam.  Current complaints: none. She had her IUD removed recently with hopes of having another child. Partner is not ready for another child.   Denies abnormal vaginal bleeding, discharge, pelvic pain, problems with intercourse or other gynecologic concerns.    Gynecologic History Patient's last menstrual period was 12/14/2021. Contraception: IUD Last Pap: 2015. Result was normal with negative HPV Last Mammogram: NA- due in July.   Obstetric History OB History  Gravida Para Term Preterm AB Living  1 1 1  0 0 1  SAB IAB Ectopic Multiple Live Births  0 0 0 0 1    # Outcome Date GA Lbr Len/2nd Weight Sex Delivery Anes PTL Lv  1 Term 02/14/15 [redacted]w[redacted]d 12:31 / 01:50 6 lb 3.7 oz (2.825 kg) M Vag-Spont EPI  LIV    Past Medical History:  Diagnosis Date   Kidney stone    Ovarian cyst     Past Surgical History:  Procedure Laterality Date   FOOT SURGERY     FOOT SURGERY Left 2008-2009   KIDNEY STONE SURGERY     LASIK     OVARIAN CYST REMOVAL     TONSILLECTOMY      Current Outpatient Medications on File Prior to Visit  Medication Sig Dispense Refill   cetirizine (ZYRTEC) 10 MG tablet Take 10 mg by mouth daily.     FLUoxetine (PROZAC) 10 MG capsule Take by mouth daily.     diclofenac (VOLTAREN) 75 MG EC tablet diclofenac sodium 75 mg tablet,delayed release  TAKE 1 TABLET BY MOUTH TWICE A DAY FOR 5 DAYS (Patient not taking: Reported on 12/06/2021)     guaiFENesin-codeine (ROBITUSSIN AC) 100-10 MG/5ML syrup Cheratussin AC 10 mg-100 mg/5 mL oral liquid  TAKE 5 TO 10 MLS BY MOUTH EVERY 4 TO 6 HOURS MAINLY AT BEDTIME (Patient not taking: Reported on 12/06/2021)     No current facility-administered medications on file prior to visit.    No Known Allergies  Social History:  reports that she has never smoked. She has never used smokeless  tobacco. She reports that she does not drink alcohol and does not use drugs.  Family History  Problem Relation Age of Onset   Cancer Maternal Aunt        breast   Cancer Maternal Grandmother        colon   Cancer Maternal Grandfather        lung   Cancer Paternal Grandmother        bladder    The following portions of the patient's history were reviewed and updated as appropriate: allergies, current medications, past family history, past medical history, past social history, past surgical history and problem list.  Review of Systems Pertinent items noted in HPI and remainder of comprehensive ROS otherwise negative.  Physical Exam:  BP 129/79    Pulse 69    Resp 16    Ht 5\' 7"  (1.702 m)    Wt 174 lb (78.9 kg)    LMP 12/14/2021    BMI 27.25 kg/m  CONSTITUTIONAL: Well-developed, well-nourished female in no acute distress.  HENT:  Normocephalic, atraumatic, External right and left ear normal.  EYES: Conjunctivae and EOM are normal. Pupils are equal, round, and reactive to light. No scleral icterus.  NECK: Normal range of motion, supple, no masses.  Normal thyroid.  SKIN: Skin  is warm and dry. No rash noted. Not diaphoretic. No erythema. No pallor. MUSCULOSKELETAL: Normal range of motion. No tenderness.  No cyanosis, clubbing, or edema. NEUROLOGIC: Alert and oriented to person, place, and time. Normal reflexes, muscle tone coordination.  PSYCHIATRIC: Normal mood and affect. Normal behavior. Normal judgment and thought content. CARDIOVASCULAR: Normal heart rate noted, regular rhythm RESPIRATORY: Clear to auscultation bilaterally. Effort and breath sounds normal, no problems with respiration noted. BREASTS: Symmetric in size. No masses, tenderness, skin changes, nipple drainage, or lymphadenopathy bilaterally. Performed in the presence of a chaperone. ABDOMEN: Soft, no distention noted.  No tenderness, rebound or guarding.  PELVIC: Normal appearing external genitalia and urethral meatus;  normal appearing vaginal mucosa and cervix.  No abnormal vaginal discharge noted.  Pap smear obtained.  Normal uterine size, no other palpable masses, no uterine or adnexal tenderness. IUD placed without difficulty.   Performed in the presence of a chaperone.   Assessment and Plan:    1. Encounter for IUD insertion  - POCT urine pregnancy - MM Digital Screening; Future - 4 week string check.  2. Women's annual routine gynecological examination  - MM Digital Screening; Future  - pap collected   Will follow up results of pap smear and manage accordingly. Mammogram scheduled      GYNECOLOGY CLINIC PROCEDURE NOTE  Maryjo Ragon is a 41 y.o. G1P1001 here for [facility] IUD insertion. No GYN concerns.  Last pap smear was on 2/14.   IUD Insertion Procedure Note Patient identified, informed consent performed, consent signed.   Discussed risks of irregular bleeding, cramping, infection, malpositioning or misplacement of the IUD outside the uterus which may require further procedure such as laparoscopy. Time out was performed.  Urine pregnancy test negative.  Speculum placed in the vagina.  Cervix visualized.  Cleaned with Betadine x 2.  Grasped anteriorly with a single tooth tenaculum.  Uterus sounded to 7 cm.  Kylena  IUD placed per manufacturer's recommendations.  Strings trimmed to 3 cm. Tenaculum was removed, good hemostasis noted.  Patient tolerated procedure well.   Patient was given post-procedure instructions.  She was advised to have backup contraception for one week.  Patient was also asked to check IUD strings periodically and follow up in 4 weeks for IUD check.   Duane Lope, NP 01/03/2022 9:31 AM

## 2022-01-03 NOTE — Addendum Note (Signed)
Addended by: Granville Lewis on: 01/03/2022 09:46 AM   Modules accepted: Orders

## 2022-01-03 NOTE — Progress Notes (Signed)
Neg

## 2022-01-05 LAB — CYTOLOGY - PAP
Comment: NEGATIVE
Diagnosis: NEGATIVE
High risk HPV: NEGATIVE

## 2022-03-09 DIAGNOSIS — L719 Rosacea, unspecified: Secondary | ICD-10-CM | POA: Diagnosis not present

## 2022-06-16 ENCOUNTER — Ambulatory Visit
Admission: RE | Admit: 2022-06-16 | Discharge: 2022-06-16 | Disposition: A | Discharge status: Home / Self Care | Payer: BC Managed Care – PPO | Source: Ambulatory Visit | Attending: Obstetrics and Gynecology | Admitting: Obstetrics and Gynecology

## 2022-06-16 DIAGNOSIS — Z30430 Encounter for insertion of intrauterine contraceptive device: Secondary | ICD-10-CM

## 2022-06-16 DIAGNOSIS — Z01419 Encounter for gynecological examination (general) (routine) without abnormal findings: Secondary | ICD-10-CM

## 2022-06-16 DIAGNOSIS — Z1231 Encounter for screening mammogram for malignant neoplasm of breast: Secondary | ICD-10-CM | POA: Diagnosis not present

## 2022-06-20 ENCOUNTER — Other Ambulatory Visit: Payer: Self-pay | Admitting: Obstetrics and Gynecology

## 2022-06-20 DIAGNOSIS — R928 Other abnormal and inconclusive findings on diagnostic imaging of breast: Secondary | ICD-10-CM

## 2022-06-24 ENCOUNTER — Other Ambulatory Visit: Payer: Self-pay | Admitting: Obstetrics and Gynecology

## 2022-06-24 ENCOUNTER — Ambulatory Visit
Admission: RE | Admit: 2022-06-24 | Discharge: 2022-06-24 | Disposition: A | Discharge status: Home / Self Care | Payer: BC Managed Care – PPO | Source: Ambulatory Visit | Attending: Obstetrics and Gynecology | Admitting: Obstetrics and Gynecology

## 2022-06-24 DIAGNOSIS — R928 Other abnormal and inconclusive findings on diagnostic imaging of breast: Secondary | ICD-10-CM

## 2022-06-24 DIAGNOSIS — R921 Mammographic calcification found on diagnostic imaging of breast: Secondary | ICD-10-CM | POA: Diagnosis not present

## 2022-06-30 ENCOUNTER — Ambulatory Visit
Admission: RE | Admit: 2022-06-30 | Discharge: 2022-06-30 | Disposition: A | Discharge status: Home / Self Care | Payer: BC Managed Care – PPO | Source: Ambulatory Visit | Attending: Obstetrics and Gynecology | Admitting: Obstetrics and Gynecology

## 2022-06-30 ENCOUNTER — Other Ambulatory Visit: Payer: Self-pay | Admitting: Obstetrics and Gynecology

## 2022-06-30 DIAGNOSIS — R921 Mammographic calcification found on diagnostic imaging of breast: Secondary | ICD-10-CM

## 2022-06-30 DIAGNOSIS — R928 Other abnormal and inconclusive findings on diagnostic imaging of breast: Secondary | ICD-10-CM

## 2022-07-15 DIAGNOSIS — H6691 Otitis media, unspecified, right ear: Secondary | ICD-10-CM | POA: Diagnosis not present

## 2022-07-15 DIAGNOSIS — J029 Acute pharyngitis, unspecified: Secondary | ICD-10-CM | POA: Diagnosis not present

## 2022-07-15 DIAGNOSIS — U071 COVID-19: Secondary | ICD-10-CM | POA: Diagnosis not present

## 2023-01-01 ENCOUNTER — Ambulatory Visit
Admission: RE | Admit: 2023-01-01 | Discharge: 2023-01-01 | Disposition: A | Discharge status: Home / Self Care | Payer: Managed Care, Other (non HMO) | Source: Ambulatory Visit | Attending: Obstetrics and Gynecology | Admitting: Obstetrics and Gynecology

## 2023-01-01 DIAGNOSIS — R921 Mammographic calcification found on diagnostic imaging of breast: Secondary | ICD-10-CM

## 2023-01-09 ENCOUNTER — Other Ambulatory Visit (HOSPITAL_COMMUNITY)
Admission: RE | Admit: 2023-01-09 | Discharge: 2023-01-09 | Disposition: A | Discharge status: Home / Self Care | Payer: Managed Care, Other (non HMO) | Source: Ambulatory Visit | Attending: Obstetrics and Gynecology | Admitting: Obstetrics and Gynecology

## 2023-01-09 ENCOUNTER — Encounter: Payer: Self-pay | Admitting: Obstetrics and Gynecology

## 2023-01-09 ENCOUNTER — Ambulatory Visit: Payer: Managed Care, Other (non HMO) | Admitting: Obstetrics and Gynecology

## 2023-01-09 VITALS — BP 144/103 | HR 71 | Ht 67.0 in | Wt 149.0 lb

## 2023-01-09 DIAGNOSIS — Z202 Contact with and (suspected) exposure to infections with a predominantly sexual mode of transmission: Secondary | ICD-10-CM | POA: Diagnosis present

## 2023-01-09 DIAGNOSIS — N760 Acute vaginitis: Secondary | ICD-10-CM | POA: Diagnosis not present

## 2023-01-09 DIAGNOSIS — Z975 Presence of (intrauterine) contraceptive device: Secondary | ICD-10-CM

## 2023-01-09 DIAGNOSIS — F419 Anxiety disorder, unspecified: Secondary | ICD-10-CM | POA: Diagnosis not present

## 2023-01-09 DIAGNOSIS — N921 Excessive and frequent menstruation with irregular cycle: Secondary | ICD-10-CM

## 2023-01-09 NOTE — Progress Notes (Signed)
GYNECOLOGY VISIT  Patient name: Julia Wolfe MRN [id number]  Date of birth: August 14, 1981 Chief Complaint:   New Patient (Initial Visit), Menstrual Problem (IUD placed in 2023), and Headache  History:  Julia Wolfe is a 42 y.o. G1P1001 being seen today for abnormal .  Had a previous IUD without issue. Had a Cu-IUD and body "rejected" it. Kyleena inserted 01/2022. Initially no cramping, no periods; now having inconsistent bleeding since November may be a few days and up to 2 weeks. Uncomfortable with the bleeding, cramping sensation. No abnormal discharge. Has had increased stress with increased headaches as well Going through Divorce. One partner, sexually active. Has had bleeding after intercourse a few times   Not currently talking to someone about anxiety/depression, currently on meds. Had spoken to whomever originally prescribed meds but switched insurance sice then  Past Medical History:  Diagnosis Date   Kidney stone    Ovarian cyst     Past Surgical History:  Procedure Laterality Date   FOOT SURGERY     FOOT SURGERY Left 2008-2009   KIDNEY STONE SURGERY     LASIK     OVARIAN CYST REMOVAL     TONSILLECTOMY      The following portions of the patient's history were reviewed and updated as appropriate: allergies, current medications, past family history, past medical history, past social history, past surgical history and problem list.   Health Maintenance:   Last pap     Component Value Date/Time   DIAGPAP  01/03/2022 0946    - Negative for intraepithelial lesion or malignancy (NILM)   [place] Negative 01/03/2022 0946   ADEQPAP  01/03/2022 0946    Satisfactory for evaluation; transformation zone component PRESENT.   Last mammogram: 06/2022 BIRADS 3 > right mammo BIRADS 2   Review of Systems:  Pertinent items are noted in HPI. Comprehensive review of systems was otherwise negative.   Objective:  Physical Exam BP (!) 144/103   Pulse 71   Ht '5\' 7"'$   (1.702 m)   Wt 149 lb (67.6 kg)   LMP 12/26/2022   BMI 23.34 kg/m    Physical Exam Vitals and nursing note reviewed. Exam conducted with a chaperone present.  Constitutional:      Appearance: Normal appearance.  HENT:     Head: Normocephalic and atraumatic.  Pulmonary:     Effort: Pulmonary effort is normal.     Breath sounds: Normal breath sounds.  Abdominal:     Palpations: Abdomen is soft.     Tenderness: There is no abdominal tenderness.  Genitourinary:    General: Normal vulva.     Exam position: Lithotomy position.     Vagina: Normal.     Cervix: No cervical motion tenderness, friability or cervical bleeding.     Uterus: Not tender.      Comments: Left sided pelvic floor tenderness Right sided non-tender   Non-tender uterus  IUD strings visualized  Skin:    General: Skin is warm and dry.  Neurological:     General: No focal deficit present.     Mental Status: She is alert.  Psychiatric:        Mood and Affect: Mood normal.        Behavior: Behavior normal.        Thought Content: Thought content normal.        Judgment: Judgment normal.       Assessment & Plan:   1. Breakthrough bleeding with IUD New onset breakthrough bleeding with  IUD in place, without prior issues.  IUD appears to be in correct position based on pelvic exam today.  No evidence of PID on exam today.  No cervical lesions noted.  Last Pap within normal limits.  2. IUD (intrauterine device) in place Ordered for pelvic ultrasound to assess position of IUD, as well as for intrauterine structural changes that may contribute to bleeding. - US PELVIC COMPLETE WITH TRANSVAGINAL; Future  3. Encounter for assessment of STD exposure Vaginitis collected.  - Cervicovaginal ancillary only( [place])  4. Anxiety Patient notes that she has significant stressors in life, including current divorce, with possible sequelae of increased headaches.  Patient currently on antidepressants, not currently  engaged in therapy.  Patient offered referral to [facility], as well as psychology today for an [facility] provider. - Amb ref to [place]   Routine preventative health maintenance measures emphasized.  Darliss Cheney, MD [facility]

## 2023-01-09 NOTE — Patient Instructions (Addendum)
We are getting an ultrasound to look for changes within the uterus and verify position  We did swabs today to check for possible infections  Referral placed to behavioral health regarding anxiety and depression, especially surrounding your recent stressors

## 2023-01-10 LAB — CERVICOVAGINAL ANCILLARY ONLY
Bacterial Vaginitis (gardnerella): POSITIVE — AB
Candida Glabrata: NEGATIVE
Candida Vaginitis: NEGATIVE
Chlamydia: NEGATIVE
Comment: NEGATIVE
Comment: NEGATIVE
Comment: NEGATIVE
Comment: NEGATIVE
Comment: NEGATIVE
Comment: NORMAL
Neisseria Gonorrhea: NEGATIVE
Trichomonas: NEGATIVE

## 2023-01-11 ENCOUNTER — Telehealth: Payer: Self-pay | Admitting: General Practice

## 2023-01-11 ENCOUNTER — Other Ambulatory Visit: Payer: Self-pay | Admitting: Obstetrics and Gynecology

## 2023-01-11 DIAGNOSIS — N760 Acute vaginitis: Secondary | ICD-10-CM

## 2023-01-11 MED ORDER — METRONIDAZOLE 500 MG PO TABS: 500.0000 mg | ORAL_TABLET | Freq: Two times a day (BID) | ORAL | 0 refills | Status: DC

## 2023-01-11 NOTE — Telephone Encounter (Signed)
Called patient, no answer- left message stating we are trying to reach you with results, please call us back.

## 2023-01-11 NOTE — Telephone Encounter (Signed)
Patient called and left message stating she is returning our phone call.   Called patient and informed her of results and medication directions. Patient verbalized understanding.

## 2023-01-11 NOTE — Telephone Encounter (Signed)
-----   Message from Darliss Cheney, MD sent at 01/11/2023  8:59 AM EST ----- Notify of BV on vaginitis swab and rx sent to pharmacy

## 2023-01-16 ENCOUNTER — Ambulatory Visit
Admission: RE | Admit: 2023-01-16 | Discharge: 2023-01-16 | Disposition: A | Discharge status: Home / Self Care | Payer: Managed Care, Other (non HMO) | Source: Ambulatory Visit | Attending: Obstetrics and Gynecology | Admitting: Obstetrics and Gynecology

## 2023-01-16 DIAGNOSIS — Z975 Presence of (intrauterine) contraceptive device: Secondary | ICD-10-CM | POA: Diagnosis present

## 2023-01-16 DIAGNOSIS — Z308 Encounter for other contraceptive management: Secondary | ICD-10-CM | POA: Diagnosis not present

## 2023-02-02 NOTE — BH Specialist Note (Signed)
Integrated Behavioral Health Initial In-Person Visit  MRN: [id number] Name: Julia Wolfe  Number of [place] Clinician visits: 1- Initial Visit  Session Start time: 1000    Session End time: [id number]  Total time in minutes: 14   Types of Service: Introduction only; Rescheduled visit due to son present  Interpretor:No. Interpretor Name and Language: n/a   Warm Hand Off Completed.        Subjective: Sioban Liebold is a 42 y.o. female accompanied by  8yo son Patient was referred by Darliss Cheney, MD for anxiety.  Objective: Mood: NA and Affect: Appropriate Risk of harm to self or others: No plan to harm self or others  Interventions: Interventions utilized:  Introduction to Integrated BH care only   Standardized Assessments completed: GAD-7 and PHQ 9  Patient and/or Family Response: Patient agrees with treatment plan.   Patient Centered Plan: Patient is on the following Treatment Plan(s):  IBH  Assessment: Patient currently experiencing Anxiety disorder, unspecified   Patient may benefit from therapeutic interventions at upcoming visit.  Plan: Follow up with behavioral health clinician on : Two weeks Behavioral recommendations:  -Continue taking BH medication as prescribed Referral(s): [place] (In Clinic)  [place], [place]     02/14/2023   10:16 AM 01/09/2023    3:58 PM 08/21/2014   11:37 AM 08/07/2014    8:49 AM  Depression screen PHQ 2/9  Decreased Interest 1 2 0 0  Down, Depressed, Hopeless 2 1 0 0  PHQ - 2 Score 3 3 0 0  Altered sleeping 1 2    Tired, decreased energy 2 2    Change in appetite 1 1    Feeling bad or failure about yourself  1 0    Trouble concentrating 2 1    Moving slowly or fidgety/restless 0 0    Suicidal thoughts 0 0    PHQ-9 Score 10 9        02/14/2023   10:22 AM 01/09/2023    3:58 PM  GAD 7 : Generalized Anxiety Score  Nervous, Anxious, on Edge 2 1   Control/stop worrying 2 1  Worry too much - different things 2 1  Trouble relaxing 1 1  Restless 0 0  Easily annoyed or irritable 1 0  Afraid - awful might happen 1 0  Total GAD 7 Score 9 4

## 2023-02-14 ENCOUNTER — Ambulatory Visit: Payer: Self-pay | Admitting: Clinical

## 2023-02-14 DIAGNOSIS — F419 Anxiety disorder, unspecified: Secondary | ICD-10-CM

## 2023-02-14 NOTE — Patient Instructions (Signed)
[facility]  [facility] [address] [place], [place] [address] [phone] (main office) [phone] (Aela Bohan's office)  /Emotional Wellbeing Apps and Websites Here are a few free apps meant to help you to help yourself.  To find, try searching on the internet to see if the app is offered on Apple/Android devices. If your first choice doesn't come up on your device, the good news is that there are many choices! Play around with different apps to see which ones are helpful to you.    Calm This is an app meant to help increase calm feelings. Includes info, strategies, and tools for tracking your feelings.      Calm Harm  This app is meant to help with self-harm. Provides many 5-minute or 15-min coping strategies for doing instead of hurting yourself.       Healthy Minds Health Minds is a problem-solving tool to help deal with emotions and cope with stress you encounter wherever you are.      MindShift This app can help people cope with anxiety. Rather than trying to avoid anxiety, you can make an important shift and face it.      MY3  MY3 features a support system, safety plan and resources with the goal of offering a tool to use in a time of need.       My Life My Voice  This mood journal offers a simple solution for tracking your thoughts, feelings and moods. Animated emoticons can help identify your mood.       Relax Melodies Designed to help with sleep, on this app you can mix sounds and meditations for relaxation.      Smiling Mind Smiling Mind is meditation made easy: it's a simple tool that helps put a smile on your mind.        Stop, Breathe & Think  A friendly, simple guide for people through meditations for mindfulness and compassion.  Stop, Breathe and Think Kids Enter your current feelings and choose a "mission" to help you cope. Offers videos for certain moods instead of just sound recordings.       Team  Orange The goal of this tool is to help teens change how they think, act, and react. This app helps you focus on your own good feelings and experiences.      The Virtual Hope Box The Virtual Hope Box (VHB) contains simple tools to help patients with coping, relaxation, distraction, and positive thinking.     

## 2023-02-19 NOTE — BH Specialist Note (Signed)
Pt did not arrive to video visit and did not answer the phone; Left HIPPA-compliant message to call back Kris Burd from [facility] [place] [facility] at  [phone] (Ronesha Heenan's office).  ?; left MyChart message for patient.  ? ?

## 2023-03-01 ENCOUNTER — Ambulatory Visit: Payer: Self-pay | Admitting: Clinical

## 2023-03-01 DIAGNOSIS — Z91199 Patient's noncompliance with other medical treatment and regimen due to unspecified reason: Secondary | ICD-10-CM

## 2023-08-01 DIAGNOSIS — F32A Depression, unspecified: Secondary | ICD-10-CM

## 2023-08-01 HISTORY — DX: Depression, unspecified: F32.A

## 2023-10-03 ENCOUNTER — Encounter (HOSPITAL_COMMUNITY): Payer: Self-pay | Admitting: Orthopedic Surgery

## 2023-10-04 ENCOUNTER — Encounter (HOSPITAL_COMMUNITY): Payer: Self-pay | Admitting: Orthopedic Surgery

## 2023-10-04 ENCOUNTER — Other Ambulatory Visit: Payer: Self-pay

## 2023-10-04 NOTE — Progress Notes (Addendum)
Julia Wolfe denies chest pain or shortness of breath. Patient denies having any s/s of Covid in her household, also denies any known exposure to Covid. Julia Wolfe denies  any s/s of upper or lower respiratory infection in the past 8 weeks.   Julia's PCP is Dr. Brett Fairy.  Julia Wolfe is approximately [redacted] weeks pregnant, OB Dr. Is with [facility].  Dr. Yehuda Budd asked patient to find out from [facility] when the best time to have surgery, patient was told now is okay. I called Dr. Yehuda Budd office, spoke with [facility] and ask if they received orders from [facility] OB regarding fetal monitoring: Pre- Op, intra-op, post-op, or any combination or no monitoring.  [name] will get in tough with OB office to see if she can get those orders.

## 2023-10-04 NOTE — H&P (Signed)
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 female with symptoms consistent with ulnar nerve entrapment at right elbow who presents for surgical intervention. The risks, benefits and alternatives of surgical intervention were discussed and informed consent was obtained prior to surgery.  Past Medical History:  Past Medical History:  Diagnosis Date   Depression 08/01/2023   Kidney stone    Ovarian cyst    Seasonal allergies     Past Surgical History:  Past Surgical History:  Procedure Laterality Date   FOOT SURGERY     FOOT SURGERY Left 2008-2009   KIDNEY STONE SURGERY     LASIK     OVARIAN CYST REMOVAL     TONSILLECTOMY      Medications:  Prior to Admission medications   Medication Sig Start Date End Date Taking? Authorizing Provider  Doxylamine-Pyridoxine 10-10 MG TBEC Take 1-2 tablets by mouth See admin instructions. Take 2 tablets scheduled at bedtime & may take an additional dose (1 tablet) in the morning if needed for nausea/vomiting   Yes [provider]  pantoprazole (PROTONIX) 20 MG tablet Take 20 mg by mouth in the morning and at bedtime.   Yes [provider]  Prenatal Vit-Fe Fumarate-FA (MULTIVITAMIN-PRENATAL) 27-0.8 MG TABS tablet Take 1-2 tablets by mouth in the morning.   Yes [provider]    Allergies:  Patient has no known allergies.  Review of Systems: Negative except per HPI.  Physical Exam: Alert and oriented, NAD Head and neck: no masses, normal alignment CV: pulse intact Pulm: no increased work of breathing, respirations even and unlabored Abdomen: non-distended Extremities: extremities warm and well perfused  LABS: No results found for this or any previous visit (from the past 2160  hour(s)).   Complete History and Physical exam available in the office notes  Julia Wolfe

## 2023-10-04 NOTE — Anesthesia Preprocedure Evaluation (Addendum)
Anesthesia Evaluation  Patient identified by MRN, date of birth, ID band Patient awake    Reviewed: Allergy & Precautions, NPO status , Patient's Chart, lab work & pertinent test results  Airway Mallampati: II  TM Distance: >3 FB Neck ROM: Full    Dental no notable dental hx.    Pulmonary neg pulmonary ROS   Pulmonary exam normal breath sounds clear to auscultation       Cardiovascular negative cardio ROS Normal cardiovascular exam Rhythm:Regular Rate:Normal     Neuro/Psych  PSYCHIATRIC DISORDERS  Depression    negative neurological ROS     GI/Hepatic Neg liver ROS,GERD  ,,  Endo/Other  negative endocrine ROS    Renal/GU negative Renal ROS  negative genitourinary   Musculoskeletal negative musculoskeletal ROS (+)    Abdominal   Peds  Hematology negative hematology ROS (+)   Anesthesia Other Findings FHR 154  Reproductive/Obstetrics (+) Pregnancy Fetal heart tones before and after procedure                             Anesthesia Physical Anesthesia Plan  ASA: 2  Anesthesia Plan: MAC and Regional   Post-op Pain Management: Regional block*   Induction: Intravenous  PONV Risk Score and Plan: 2 and Propofol infusion, Treatment may vary due to age or medical condition, Midazolam and Ondansetron  Airway Management Planned: Natural Airway  Additional Equipment:   Intra-op Plan:   Post-operative Plan:   Informed Consent: I have reviewed the patients History and Physical, chart, labs and discussed the procedure including the risks, benefits and alternatives for the proposed anesthesia with the patient or authorized representative who has indicated his/her understanding and acceptance.     Dental advisory given  Plan Discussed with: CRNA  Anesthesia Plan Comments: (See PAT note written 11/08/2023 by Shonna Chock, PA-C. Patient is pregnant with EDD 05/01/24.  OB is with [facility] [place] [facility] Dr. Alvester Morin reviewed and recommended "She will need fetal doppler heart tones before and after the procedure." OB Rapid Response RN aware and will need to be called on DOS at [phone].   )       Anesthesia Quick Evaluation

## 2023-10-04 NOTE — Progress Notes (Signed)
Anesthesia Chart Review: Maury Dus  Case: [id number] Date/Time: 10/05/23 0715   Procedure: RIGHT CUBITAL TUNNEL RELEASE IN SITU, POSSIBLE TRANSPOSITION (Right) - BLOCK AND MAC   Anesthesia type: Regional   Pre-op diagnosis: Ulnar nerve entrapment at elbow   Location: [facility] OR ROOM 05 / [facility] OR   Surgeons: Gomez Cleverly, MD       DISCUSSION: Patient is a 42 year old PREGNANT female scheduled for the above procedure.  Recently evaluated at [facility] in [place] by Dorathy Kinsman, PA-C for positive pregnancy test. Planned pregnancy with recent IUD removal. No LMP record, but by transvaginal US EDC by [place] was 05/01/2024 consistent with 8w 5d gestation on 09/25/23. FHR 174 bpm.   Dr. Yehuda Budd is aware of pregnancy and had asked her to discuss with her OB the safest time to move forward with surgery. Reportedly, she was told she was okay to proceed with surgery but [name] at Dr. Yehuda Budd office is working on getting the appropriate documentation prior to surgery which is a first case on 10/05/23.      VS: Ht 5\' 7"  (1.702 m)   BMI 23.34 kg/m  On 09/25/23 ([facility]): BP 122/79, HR 65. WT 75.3 KG.  PROVIDERS: Gwenlyn Found, MD is PCP  OB is with [facility] in [place] (phone [phone], fax [phone])   LABS: For day of surgery as indicated. As of 03/15/23, glucose 106, Creatinine 0.8, AST 16, ALT 15, WBC 8.93, H/H 13.9/40.8, PLT 389.   EKG: N/A   CV: N/A  Past Medical History:  Diagnosis Date   Depression 08/01/2023   GERD (gastroesophageal reflux disease)    History of kidney stones    Ovarian cyst    Seasonal allergies     Past Surgical History:  Procedure Laterality Date   FOOT SURGERY     FOOT SURGERY Left 2008-2009   KIDNEY STONE SURGERY     LASIK     OVARIAN CYST REMOVAL     TONSILLECTOMY      MEDICATIONS: No current facility-administered medications for this encounter.    Doxylamine-Pyridoxine 10-10 MG TBEC   pantoprazole  (PROTONIX) 20 MG tablet   Prenatal Vit-Fe Fumarate-FA (MULTIVITAMIN-PRENATAL) 27-0.8 MG TABS tablet    Shonna Chock, PA-C Surgical Short Stay/Anesthesiology [facility] Phone [phone] [facility] Phone [phone] 10/04/2023 4:53 PM

## 2023-11-08 ENCOUNTER — Other Ambulatory Visit: Payer: Self-pay

## 2023-11-08 ENCOUNTER — Encounter: Payer: Self-pay | Admitting: Family Medicine

## 2023-11-08 ENCOUNTER — Encounter (HOSPITAL_COMMUNITY): Payer: Self-pay | Admitting: Orthopedic Surgery

## 2023-11-08 NOTE — Progress Notes (Signed)
Anesthesia Chart Review: Julia Wolfe  Case: [id number] Date/Time: 11/09/23 1545   Procedure: RIGHT CUBITAL TUNNEL RELEASE IN SITU, POSSIBLE TRANSPOSITION (Right) - BLOCK AND MAC   Anesthesia type: Regional   Pre-op diagnosis: Ulnar nerve entrapment at elbow   Location: [facility] OR ROOM 04 / [facility] OR   Surgeons: Julia Cleverly, MD       DISCUSSION: Patient is a 42 year old PREGNANT female scheduled for the above procedure. EDD 05/01/2024.   Recently evaluated at [facility] in [place] by Julia Kinsman, PA-C for positive pregnancy test. Planned pregnancy with recent IUD removal. No LMP record, but by transvaginal US EDC by [place] was 05/01/2024 consistent with 8w 5d gestation on 09/25/23. FHR 174 bpm.    Dr. Yehuda Wolfe is aware of pregnancy and had asked her to discuss with her OB the safest time to move forward with surgery. Reportedly, she was told she was okay to proceed with surgery but Julia Wolfe at Dr. Yehuda Wolfe office is working on getting the appropriate documentation prior to surgery which is a first case on 10/05/23.  Last OB follow-up 10/29/23 with Julia Kinsman, PA-C.  Routine 4 week follow-up planned. She previously had a pre-operative visit on 10/09/23 with [place], Julia Grebe, MD to discuss ideal timing for right ulnar nerve surgery. Dr. Elwin Wolfe wrote, "I have reviewed with Julia Wolfe her ulnar nerve dysfunction. She is having daily pain that wakes her from sleep and limited ability to grasp and use her hand due to ulnar nerve compression. She is scheduled for orthopedic intervention and release of the nerve and presents today to discuss ideal timing of surgical intervention. Given her daily symptoms this is not considered an elective surgery. I have reviewed with her that ideally for nonemergent surgical needs during pregnancy we would like for her to wait to the end of the first/early second trimester. She understands anesthesia has inherent risk of effects on the pregnancy including fetal  malformation, miscarriage.  I have given the patient a letter in regards to this. Together joint decision-making was completed and she will wait until early second trimester for scheduling surgery. She will return for regularly scheduled OB care."  Based on EDD, she will be 15 weeks 1 day on surgery date 11/09/23. Since her OB is outside of [facility], I contacted [place] unassigned OB physician to review and clarify preoperative fetal heart monitoring recommendations. Per Julia Wolfe (see Progress Note for documentation), "She will need fetal doppler heart tones before and after the procedure." I have notified OB Rapid Response RN of recommendations and timing of surgery.   Anesthesia team to evaluate on the day of surgery.   VS: Ht 5\' 7"  (1.702 m)   BMI 23.34 kg/m  BP 108/68 10/29/23 ([facility])   PROVIDERS: Julia Found, MD is PCP  OB is with [facility] in [place] (phone [phone], fax [phone])   LABS: For day of surgery as indicated. As of 03/15/23, glucose 106, Creatinine 0.8, AST 16, ALT 15, WBC 8.93, H/H 13.9/40.8, PLT 389.    IMAGES: US OB 09/25/23 ([facility]): Gestational sac: round, high in uterus, retroflexed uterus  Yolk sac: present  Fetal pole: present  CRL: 2.1cm c/w [redacted]w[redacted]d  FHR: 174 bpm  Subchorionic hemorrhage: absent  EDC by LMP: recent IUD removal  EDC by [place] today: 05/01/2024 c/w 8 w5d  Final EDC: 05/01/2024  Adnexa: normal  Rt Ovary: WNL  Lft Ovary: CL cyst, 2.6x 2.7x 2.9cm  Cervix: long, closed  EKG: N/A   CV: N/A  Past Medical History:  Diagnosis Date   Depression 08/01/2023   GERD (gastroesophageal reflux disease)    History of kidney stones    Ovarian cyst    Seasonal allergies     Past Surgical History:  Procedure Laterality Date   FOOT SURGERY     FOOT SURGERY Left 2008-2009   KIDNEY STONE SURGERY     LASIK     OVARIAN CYST REMOVAL     TONSILLECTOMY      MEDICATIONS: No current  facility-administered medications for this encounter.    Doxylamine-Pyridoxine 10-10 MG TBEC   pantoprazole (PROTONIX) 20 MG tablet   Prenatal Vit-Fe Fumarate-FA (MULTIVITAMIN-PRENATAL) 27-0.8 MG TABS tablet    Julia Chock, PA-C Surgical Short Stay/Anesthesiology [facility] Phone [phone] [facility] Phone [phone] 11/08/2023 11:53 AM

## 2023-11-08 NOTE — Progress Notes (Signed)
PCP - Gwenlyn Found, MD  Cardiologist -   PPM/ICD - denies Device Orders - n/a Rep Notified - n/a  Chest x-ray - denies EKG - denies Stress Test - denies ECHO - denies Cardiac Cath - denies  CPAP - denies  DM - denies  Blood Thinner Instructions: denies Aspirin Instructions: will take after procedure per patient  ERAS Protcol - clear liquids until 1:00 Pm  COVID TEST- no  Anesthesia review: yes,pregnant  Patient verbally denies any shortness of breath, fever, cough and chest pain during phone call   -------------  SDW INSTRUCTIONS given:  Your procedure is scheduled on November 09, 2023.  Report to [facility] Main Entrance "A" at 1:30 P. M., and check in at the Admitting office.  Call this number if you have problems the morning of surgery:  [phone]   Remember:  Do not eat after midnight the night before your surgery  You may drink clear liquids until 1:00 the day of your surgery.   Clear liquids allowed are: Water, Non-Citrus Juices (without pulp), Carbonated Beverages, Clear Tea, Black Coffee Only, and Gatorade    Take these medicines the morning of surgery with A SIP OF WATER     As of today, STOP taking any Aspirin (unless otherwise instructed by your surgeon) Aleve, Naproxen, Ibuprofen, Motrin, Advil, Goody's, BC's, all herbal medications, fish oil, and all vitamins.                      Do not wear jewelry, make up, or nail polish            Do not wear lotions, powders, perfumes/colognes, or deodorant.            Do not shave 48 hours prior to surgery.  Men may shave face and neck.            Do not bring valuables to the hospital.            [facility] is not responsible for any belongings or valuables.  Do NOT Smoke (Tobacco/Vaping) 24 hours prior to your procedure If you use a CPAP at night, you may bring all equipment for your overnight stay.   Contacts, glasses, dentures or bridgework may not be worn into surgery.      For patients  admitted to the hospital, discharge time will be determined by your treatment team.   Patients discharged the day of surgery will not be allowed to drive home, and someone needs to stay with them for 24 hours.    Special instructions:   [place]- Preparing For Surgery  Before surgery, you can play an important role. Because skin is not sterile, your skin needs to be as free of germs as possible. You can reduce the number of germs on your skin by washing with CHG (chlorahexidine gluconate) Soap before surgery.  CHG is an antiseptic cleaner which kills germs and bonds with the skin to continue killing germs even after washing.    Oral Hygiene is also important to reduce your risk of infection.  Remember - BRUSH YOUR TEETH THE MORNING OF SURGERY WITH YOUR REGULAR TOOTHPASTE  Please do not use if you have an allergy to CHG or antibacterial soaps. If your skin becomes reddened/irritated stop using the CHG.  Do not shave (including legs and underarms) for at least 48 hours prior to first CHG shower. It is OK to shave your face.  Please follow these instructions carefully.  Shower the NIGHT BEFORE SURGERY and the MORNING OF SURGERY with DIAL Soap.   Pat yourself dry with a CLEAN TOWEL.  Wear CLEAN PAJAMAS to bed the night before surgery  Place CLEAN SHEETS on your bed the night of your first shower and DO NOT SLEEP WITH PETS.   Day of Surgery: Please shower morning of surgery  Wear Clean/Comfortable clothing the morning of surgery Do not apply any deodorants/lotions.   Remember to brush your teeth WITH YOUR REGULAR TOOTHPASTE.   Questions were answered. Patient verbalized understanding of instructions.

## 2023-11-08 NOTE — Progress Notes (Signed)
Discussed patient with Shonna Chock PA  Patient scheduled for ulnar nerve  She will need fetal doppler heart tones before and after the procedure   Federico Flake, MD, MPH, ABFM, [facility] Attending Physician Center for [facility]

## 2023-11-09 ENCOUNTER — Ambulatory Visit (HOSPITAL_COMMUNITY)
Admission: RE | Admit: 2023-11-09 | Discharge: 2023-11-09 | Disposition: A | Discharge status: Home / Self Care | Payer: Managed Care, Other (non HMO) | Attending: Orthopedic Surgery | Admitting: Orthopedic Surgery

## 2023-11-09 ENCOUNTER — Encounter (HOSPITAL_COMMUNITY)
Admission: RE | Disposition: A | Discharge status: Home / Self Care | Payer: Self-pay | Source: Home / Self Care | Attending: Orthopedic Surgery

## 2023-11-09 ENCOUNTER — Other Ambulatory Visit: Payer: Self-pay

## 2023-11-09 ENCOUNTER — Ambulatory Visit (HOSPITAL_COMMUNITY): Payer: Managed Care, Other (non HMO) | Admitting: Vascular Surgery

## 2023-11-09 ENCOUNTER — Encounter (HOSPITAL_COMMUNITY): Payer: Self-pay | Admitting: Orthopedic Surgery

## 2023-11-09 DIAGNOSIS — G5621 Lesion of ulnar nerve, right upper limb: Secondary | ICD-10-CM

## 2023-11-09 DIAGNOSIS — K219 Gastro-esophageal reflux disease without esophagitis: Secondary | ICD-10-CM | POA: Insufficient documentation

## 2023-11-09 HISTORY — DX: Personal history of urinary calculi: Z87.442

## 2023-11-09 HISTORY — PX: ANTERIOR INTEROSSEOUS NERVE DECOMPRESSION: SHX5735

## 2023-11-09 HISTORY — DX: Gastro-esophageal reflux disease without esophagitis: K21.9

## 2023-11-09 HISTORY — DX: Other seasonal allergic rhinitis: J30.2

## 2023-11-09 LAB — CBC
HCT: 35.4 % — ABNORMAL LOW (ref 36.0–46.0)
Hemoglobin: 11.6 g/dL — ABNORMAL LOW (ref 12.0–15.0)
MCH: 29.8 pg (ref 26.0–34.0)
MCHC: 32.8 g/dL (ref 30.0–36.0)
MCV: 91 fL (ref 80.0–100.0)
Platelets: 286 10*3/uL (ref 150–400)
RBC: 3.89 MIL/uL (ref 3.87–5.11)
RDW: 13.2 % (ref 11.5–15.5)
WBC: 9 10*3/uL (ref 4.0–10.5)
nRBC: 0 % (ref 0.0–0.2)

## 2023-11-09 SURGERY — ANTERIOR INTEROSSEOUS NERVE DECOMPRESSION
Anesthesia: Monitor Anesthesia Care | Site: Arm Lower | Laterality: Right

## 2023-11-09 MED ORDER — PROPOFOL 1000 MG/100ML IV EMUL
INTRAVENOUS | Status: AC
  Filled 2023-11-09: qty 100

## 2023-11-09 MED ORDER — CEFAZOLIN SODIUM-DEXTROSE 2-4 GM/100ML-% IV SOLN
INTRAVENOUS | Status: AC
  Filled 2023-11-09: qty 100

## 2023-11-09 MED ORDER — CEFAZOLIN SODIUM-DEXTROSE 2-4 GM/100ML-% IV SOLN
2.0000 g | INTRAVENOUS | Status: AC
  Administered 2023-11-09: 2 g via INTRAVENOUS

## 2023-11-09 MED ORDER — HYDROCODONE-ACETAMINOPHEN 5-325 MG PO TABS: 1.0000 | ORAL_TABLET | Freq: Four times a day (QID) | ORAL | 0 refills | Status: DC | PRN

## 2023-11-09 MED ORDER — ROPIVACAINE HCL 5 MG/ML IJ SOLN
INTRAMUSCULAR | Status: DC | PRN
  Administered 2023-11-09: 25 mL via PERINEURAL

## 2023-11-09 MED ORDER — DEXAMETHASONE SODIUM PHOSPHATE 10 MG/ML IJ SOLN
INTRAMUSCULAR | Status: DC | PRN
  Administered 2023-11-09: 10 mg

## 2023-11-09 MED ORDER — CHLORHEXIDINE GLUCONATE 0.12 % MT SOLN
OROMUCOSAL | Status: AC
  Administered 2023-11-09: 15 mL via OROMUCOSAL
  Filled 2023-11-09: qty 15

## 2023-11-09 MED ORDER — ORAL CARE MOUTH RINSE: 15.0000 mL | Freq: Once | OROMUCOSAL | Status: AC

## 2023-11-09 MED ORDER — SODIUM CHLORIDE 0.9 % IR SOLN
Status: DC | PRN
  Administered 2023-11-09: 1000 mL

## 2023-11-09 MED ORDER — LACTATED RINGERS IV SOLN: INTRAVENOUS | Status: DC

## 2023-11-09 MED ORDER — LIDOCAINE 2% (20 MG/ML) 5 ML SYRINGE
INTRAMUSCULAR | Status: AC
  Filled 2023-11-09: qty 5

## 2023-11-09 MED ORDER — CEFAZOLIN SODIUM-DEXTROSE 2-4 GM/100ML-% IV SOLN: 2.0000 g | INTRAVENOUS | Status: DC

## 2023-11-09 MED ORDER — MIDAZOLAM HCL 2 MG/2ML IJ SOLN
INTRAMUSCULAR | Status: AC
  Administered 2023-11-09: 2 mg via INTRAVENOUS
  Filled 2023-11-09: qty 2

## 2023-11-09 MED ORDER — PROPOFOL 10 MG/ML IV BOLUS
INTRAVENOUS | Status: DC | PRN
  Administered 2023-11-09: 30 mg via INTRAVENOUS
  Administered 2023-11-09: 70 mg via INTRAVENOUS

## 2023-11-09 MED ORDER — FENTANYL CITRATE (PF) 100 MCG/2ML IJ SOLN: 25.0000 ug | INTRAMUSCULAR | Status: DC | PRN

## 2023-11-09 MED ORDER — CHLORHEXIDINE GLUCONATE 0.12 % MT SOLN: 15.0000 mL | Freq: Once | OROMUCOSAL | Status: AC

## 2023-11-09 MED ORDER — MIDAZOLAM HCL 2 MG/2ML IJ SOLN: 2.0000 mg | Freq: Once | INTRAMUSCULAR | Status: AC

## 2023-11-09 MED ORDER — PROPOFOL 500 MG/50ML IV EMUL
INTRAVENOUS | Status: DC | PRN
  Administered 2023-11-09: 100 ug/kg/min via INTRAVENOUS

## 2023-11-09 MED ORDER — SODIUM CHLORIDE 0.9 % IV SOLN
INTRAVENOUS | Status: DC
Start: 2023-11-09 — End: 2023-11-10

## 2023-11-09 MED ORDER — FENTANYL CITRATE (PF) 100 MCG/2ML IJ SOLN
INTRAMUSCULAR | Status: AC
  Filled 2023-11-09: qty 2

## 2023-11-09 SURGICAL SUPPLY — 33 items
BAG COUNTER SPONGE SURGICOUNT (BAG) IMPLANT
BAG ZIPLOCK 12X15 (MISCELLANEOUS) ×1 IMPLANT
BNDG ELASTIC 3INX 5YD STR LF (GAUZE/BANDAGES/DRESSINGS) ×1 IMPLANT
BNDG GAUZE DERMACEA FLUFF 4 (GAUZE/BANDAGES/DRESSINGS) ×1 IMPLANT
CORD BIPOLAR FORCEPS 12FT (ELECTRODE) ×1 IMPLANT
COVER SURGICAL LIGHT HANDLE (MISCELLANEOUS) ×1 IMPLANT
DRAPE SURG 17X11 SM STRL (DRAPES) ×1 IMPLANT
DRSG ADAPTIC 3X8 NADH LF (GAUZE/BANDAGES/DRESSINGS) IMPLANT
DRSG EMULSION OIL 3X3 NADH (GAUZE/BANDAGES/DRESSINGS) ×1 IMPLANT
GAUZE SPONGE 4X4 12PLY STRL (GAUZE/BANDAGES/DRESSINGS) ×1 IMPLANT
GLOVE BIO SURGEON STRL SZ8 (GLOVE) ×1 IMPLANT
GLOVE SURG UNDER POLY LF SZ7.5 (GLOVE) ×2 IMPLANT
GOWN STRL REUS W/TWL XL LVL3 (GOWN DISPOSABLE) ×1 IMPLANT
KIT BASIN OR (CUSTOM PROCEDURE TRAY) ×1 IMPLANT
MANIFOLD NEPTUNE II (INSTRUMENTS) ×1 IMPLANT
NDL HYPO 25X1 1.5 SAFETY (NEEDLE) ×1 IMPLANT
NEEDLE HYPO 25X1 1.5 SAFETY (NEEDLE) ×1 IMPLANT
NS IRRIG 1000ML POUR BTL (IV SOLUTION) ×1 IMPLANT
PACK ORTHO EXTREMITY (CUSTOM PROCEDURE TRAY) ×1 IMPLANT
PAD CAST 3X4 CTTN HI CHSV (CAST SUPPLIES) ×1 IMPLANT
PAD CAST 4YDX4 CTTN HI CHSV (CAST SUPPLIES) IMPLANT
PENCIL SMOKE EVACUATOR (MISCELLANEOUS) IMPLANT
PROTECTOR NERVE ULNAR (MISCELLANEOUS) ×1 IMPLANT
SLING ARM FOAM STRAP LRG (SOFTGOODS) IMPLANT
SOL PREP POV-IOD 4OZ 10% (MISCELLANEOUS) ×1 IMPLANT
SOL SCRUB PVP POV-IOD 4OZ 7.5% (MISCELLANEOUS) ×1
SOLUTION SCRB POV-IOD 4OZ 7.5% (MISCELLANEOUS) ×1 IMPLANT
SPIKE FLUID TRANSFER (MISCELLANEOUS) ×1 IMPLANT
SUT ETHIBOND CT1 BRD 2-0 30IN (SUTURE) IMPLANT
SUT PROLENE 3 0 PS 2 (SUTURE) ×1 IMPLANT
SUT VIC AB 3-0 PS2 18XBRD (SUTURE) ×1 IMPLANT
SYR CONTROL 10ML LL (SYRINGE) ×1 IMPLANT
WATER STERILE IRR 1000ML POUR (IV SOLUTION) ×1 IMPLANT

## 2023-11-09 NOTE — Transfer of Care (Signed)
Immediate Anesthesia Transfer of Care Note  Patient: Julia Wolfe  Procedure(s) Performed: RIGHT CUBITAL TUNNEL RELEASE IN SITU, POSSIBLE TRANSPOSITION (Right: Arm Lower)  Patient Location: PACU  Anesthesia Type:MAC  Level of Consciousness: awake, alert , and oriented  Airway & Oxygen Therapy: Patient Spontanous Breathing  Post-op Assessment: Report given to RN and Post -op Vital signs reviewed and stable  Post vital signs: Reviewed and stable  Last Vitals:  Vitals Value Taken Time  BP 118/72 11/09/23 1757  Temp    Pulse 75 11/09/23 1758  Resp 14 11/09/23 1758  SpO2 98 % 11/09/23 1758  Vitals shown include unfiled device data.  Last Pain:  Vitals:   11/09/23 1630  TempSrc:   PainSc: 0-No pain         Complications: No notable events documented.

## 2023-11-09 NOTE — Discharge Instructions (Signed)
  Orthopaedic Hand Surgery Discharge Instructions  WEIGHT BEARING STATUS: Non weight bearing on operative extremity  DRESSING CARE: Please keep your dressing/splint/cast clean and dry until your follow-up appointment. You may shower by placing a waterproof covering over your dressing/splint/cast. Contact your surgeon if your splint/cast gets wet. It will need to be changed to prevent skin breakdown.  PAIN CONTROL: First line medications for post operative pain control are Tylenol (acetaminophen) and Motrin (ibuprofen) if you are able to take these medications. If you have been prescribed a medication these can be taken as breakthrough pain medications. Please note that some narcotic pain medication has acetaminophen added and you should never consume more than 4,000mg of acetaminophen in 24-hour period. Please note that if you are given Toradol (ketorolac) you should not take similar medications such as ibuprofen or naproxen.  DISCHARGE MEDICATIONS: If you have been prescribed medication it was sent electronically to your pharmacy. No changes have been made to your home medications.  ICE/ELEVATION: Ice and elevate your injured extremity as needed. Avoid direct contact of ice with skin.   BANDAGE FEELS TOO TIGHT: If your bandage feels too tight, first make sure you are elevating your fingers as much as possible. The outer layer of the bandage can be unwrapped and reapplied more loosely. If no improvement, you may carefully cut the inner layer longitudinally until the pressure has resolved and then rewrap the outer layer. If you are not comfortable with these instructions, please call the office and the bandage can be changed for you.   FOLLOW UP: You will be called after surgery with an appointment date and time, however if you have not received a phone call within 3 days, please call during regular office hours at [phone] to schedule a post operative appointment.  Please Seek Medical Attention  if: Call MD for: pain or pressure in chest, jaw, arm, back, neck  Call MD for: temperature greater than 101 F for more than 24 hrs Call MD for: difficulty breathing Call MD for: incision redness, bleeding, drainage  Call MD for: palpitations or feeling that the heart is racing  Call MD for: increased swelling in arm, leg, ankle, or abdomen  Call MD for: lightheadedness, dizziness, fainting Call 911 or go to ER for any medical emergency if you are not able to get in touch with your doctor   J. Reid Danyl Deems, MD Orthopaedic Hand Surgeon [facility] Office number: [phone] [address] [place], [place] [address]  

## 2023-11-09 NOTE — Anesthesia Procedure Notes (Signed)
Anesthesia Regional Block: Supraclavicular block   Pre-Anesthetic Checklist: , timeout performed,  Correct Patient, Correct Site, Correct Laterality,  Correct Procedure, Correct Position, site marked,  Risks and benefits discussed,  Pre-op evaluation,  At surgeon's request and post-op pain management  Laterality: Right  Prep: Maximum Sterile Barrier Precautions used, chloraprep       Needles:  Injection technique: Single-shot  Needle Type: Echogenic Stimulator Needle     Needle Length: 5cm  Needle Gauge: 21     Additional Needles:   Procedures:,,,, ultrasound used (permanent image in chart),,    Narrative:  Start time: 11/09/2023 4:20 PM End time: 11/09/2023 4:24 PM Injection made incrementally with aspirations every 5 mL. Anesthesiologist: Elmer Picker, MD

## 2023-11-09 NOTE — Interval H&P Note (Signed)
History and Physical Interval Note:  11/09/2023 3:39 PM  Julia Wolfe  has presented today for surgery, with the diagnosis of Ulnar nerve entrapment at elbow.  The various methods of treatment have been discussed with the patient and family. After consideration of risks, benefits and other options for treatment, the patient has consented to  Procedure(s) with comments: RIGHT CUBITAL TUNNEL RELEASE IN SITU, POSSIBLE TRANSPOSITION (Right) - BLOCK AND MAC as a surgical intervention.  The patient's history has been reviewed, patient examined, no change in status, stable for surgery.  I have reviewed the patient's chart and labs.  Questions were answered to the patient's satisfaction.     Gomez Cleverly, MD

## 2023-11-09 NOTE — Progress Notes (Signed)
[facility] called to doppler fht on a pt who is G2P1 at [redacted]wks pregnant who is having surgery on her ulnar nerve.  FHR is 154bpm pre procedure.  Dr Debroah Loop made aware.  [facility] will return for assessment when pt is in PACU.

## 2023-11-09 NOTE — Op Note (Signed)
OPERATIVE NOTE  DATE OF PROCEDURE: 11/09/2023  SURGEONS:  Primary: Gomez Cleverly, MD  ASSISTANT: Payton Mccallum, PA-C  Due to the complexity of the surgery an assistant was necessary to aid in retraction, exposure, limb positioning, closure and dressing application. The use of an assistant on this case follows CMS and CPT guidelines, which allows an assistant to be used because of the complexity level of this case.   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 female who has the above preoperative diagnosis. The patient has decided to proceed with surgical intervention.  Risks, benefits and alternatives of operative management were discussed including, but not limited to, risks of anesthesia complications, infection, pain, persistent symptoms, stiffness, need for future surgery.  The patient understands, agrees and elects to proceed with surgery.    DESCRIPTION OF PROCEDURE: The patient was met in the pre-operative area and their identity was verified.  The operative location and laterality was also verified and marked.  The patient was brought to the OR and was placed supine on the table.  After repeat patient identification with the operative team anesthesia was provided and the patient was prepped and draped in the usual sterile fashion.  A final timeout was performed verifying the correction patient, procedure, location and laterality.  The right upper extremity was prepped and draped in usual sterile fashion.  Preoperative antibiotics were provided.  It was then elevated exsanguinated with an Esmarch and tourniquet inflated to 250 mmHg.  A curvilinear  incision was made along the cubital tunnel at the medial elbow just posterior to the medial epicondyle.  Dissection was carried down protecting sensory nerve branches.  The cubital tunnel was identified and the ulnar nerve was released in situ.  At this time the elbow was brought through full range of motion and there was subluxation of the ulnar nerve anteriorly thus the incision was slightly lengthened both proximally and distally full fascial release was performed and the ulnar nerve was mobilized.  A Z-lengthening of the right elbow flexor pronator tendon was performed by making a Z step cut in the fascia the fascial flaps were then transposed the ulnar nerve was transposed anteriorly and the Z-lengthening of the flexor pronator mass tendon was secured with a 2-0 Ethibond suture.  The anterior ulnar nerve transposition was performed and secured.  The elbow was brought through full range of motion and there was no further tension on the nerve.  Excellent hemostasis was obtained.  The wound was again thoroughly irrigated and closed with absorbable sutures.  A well-padded sterile soft bandage was applied.  The tourniquet was deflated and the fingers were pink and warm and well-perfused with brisk cap refill to the tips of all digits.  All counts were correct x 2.  The patient was awoken from the anesthesia and brought to PACU for recovery in stable condition.  Of note the patient is pregnant she did receive preop and postop fetal heart tones with the consultation of OB/GYN.  Appreciate their help with this case.  The patient had a nerve block and was primarily awake throughout the procedure with some mild sedation.    Philipp Ovens, MD

## 2023-11-09 NOTE — Progress Notes (Signed)
[facility] performs doppler post op.  FHR is 141bpm.  Pt has no OB complaints at this time.  According to PACU, she will DC home after recovery.  Dr Donavan Foil made aware and has no further orders.

## 2023-11-12 ENCOUNTER — Encounter (HOSPITAL_COMMUNITY): Payer: Self-pay | Admitting: Orthopedic Surgery

## 2023-11-12 NOTE — Anesthesia Postprocedure Evaluation (Signed)
Anesthesia Post Note  Patient: Julia Wolfe  Procedure(s) Performed: RIGHT CUBITAL TUNNEL RELEASE IN SITU, POSSIBLE TRANSPOSITION (Right: Arm Lower)     Patient location during evaluation: PACU Anesthesia Type: Regional Level of consciousness: awake Pain management: pain level controlled Vital Signs Assessment: post-procedure vital signs reviewed and stable Respiratory status: spontaneous breathing, nonlabored ventilation and respiratory function stable Cardiovascular status: blood pressure returned to baseline and stable Postop Assessment: no apparent nausea or vomiting Anesthetic complications: no   No notable events documented.  Last Vitals:  Vitals:   11/09/23 1815 11/09/23 1830  BP: 122/71 121/76  Pulse: 73 81  Resp: 12 15  Temp:  36.7 C  SpO2: 99% 99%    Last Pain:  Vitals:   11/09/23 1830  TempSrc:   PainSc: 0-No pain                 [name]

## 2024-01-12 ENCOUNTER — Other Ambulatory Visit: Payer: Self-pay | Admitting: Family Medicine

## 2024-09-04 ENCOUNTER — Ambulatory Visit (INDEPENDENT_AMBULATORY_CARE_PROVIDER_SITE_OTHER): Admitting: Podiatry

## 2024-09-04 DIAGNOSIS — L600 Ingrowing nail: Secondary | ICD-10-CM | POA: Diagnosis not present

## 2024-09-04 NOTE — Patient Instructions (Signed)

## 2024-09-04 NOTE — Progress Notes (Signed)
 Subjective:  Patient ID: Julia Wolfe, female    DOB: 03-03-1981,  MRN: [id number]  Julia Wolfe presents to clinic today for:  Chief Complaint  Patient presents with   Nail Problem    R great toe nail bottom lateral side has a sliver that she has to trim or it catches and snags.  X 3 months is getting worse.  Not diabetic no anti coag     Foot Pain    R 1st met plantar pain after doing palates can not do floor exercise with put soreness  Patient coming in for above complaint.  She has history of ingrown toenails, right first toe lateral border.  She has had this treated previously and has spicule that has continued to recurre to the right first toe lateral border.  This has been previously treated years ago and she is seems to maintain it herself.  Also reports history of left foot sesamoidectomy and has some pain and stiffness to her big toe joint associated with this.  PCP is Leila Lucie LABOR, MD.  No Known Allergies  Review of Systems: Negative except as noted in the HPI.  Objective:  There were no vitals filed for this visit.  Julia Wolfe is a pleasant 43 y.o. female in NAD. AAO x 3.  Vascular Examination: Capillary refill time is less than 3 seconds to toes bilateral. Palpable pedal pulses b/l LE. Digital hair present b/l. No pedal edema b/l. Skin temperature gradient WNL b/l. No varicosities b/l. No cyanosis or clubbing noted b/l.   Dermatological Examination: Recurrent spicule of nail ingrowing in the right first toe lateral border.  No signs of infection associated with this.  There is some tenderness on palpation.  The nail is prominent.  Neurological Examination: Protective sensation intact with Semmes-Weinstein 10 gram monofilament b/l LE. Vibratory sensation intact b/l LE.      No data to display           Assessment/Plan: 1. Ingrown toenail of right foot     No orders of the defined types were placed in this encounter.  #  Recurrent spicule right first toe lateral border, ingrown toenail - Findings discussed with patient - Discussed that we will attempt removal of the toenail spicule with chemical matrixectomy - She has had this treated with chemical least once previously.  If this continues to recur we may need to discuss surgical matrixectomy - Aftercare instructions discussed with the patient with written instructions dispensed - Recommend over-the-counter Tylenol  and ibuprofen  as needed for pain.  Procedure: Right first toe ingrown toenail lateral border.,  Recurrent nail spicule. Discussed patient's condition today.  After obtaining patient consent, the right hallux was anesthetized with a 50:50 mixture of 1% lidocaine  plain and 0.5% bupivacaine  plain for a total of 3cc's administered.  The toe was prepped with Betadine upon confirmation of anesthesia, a freer elevator was utilized to free the right hallux lateral nail border and associated spicule from the nail bed.  The nail border was then avulsed proximal to the eponychium and removed in toto.  The area was inspected for any remaining spicules.  A chemical matrixectomy was performed with phenol and neutralized with isopropyl alcohol solution.  Antibiotic ointment and a DSD were applied, followed by a Coban dressing.  Patient tolerated the anesthetic and procedure well and will f/u in 2-3 weeks for recheck.  Patient given post-procedure instructions for daily 20-minute Epsom salt soaks, antibiotic ointment and daily use of Bandaids until  toe starts to dry / form eschar.   Did briefly discuss patient's left subfirst metatarsal head pain with history of sesamoidectomy, we will work this up further at follow-up, plan to obtain x-rays if needed.   Return in about 2 weeks (around 09/18/2024) for Nail Check.   Ethan Saddler, DPM, AACFAS [facility]     [address] [place], [place] [address]                 Office [phone]  Fax [phone]

## 2024-09-07 ENCOUNTER — Encounter: Payer: Self-pay | Admitting: Podiatry

## 2024-09-08 ENCOUNTER — Ambulatory Visit: Admitting: Podiatry

## 2024-09-18 ENCOUNTER — Ambulatory Visit (INDEPENDENT_AMBULATORY_CARE_PROVIDER_SITE_OTHER): Admitting: Podiatry

## 2024-09-18 ENCOUNTER — Ambulatory Visit: Admitting: Podiatry

## 2024-09-18 ENCOUNTER — Ambulatory Visit (INDEPENDENT_AMBULATORY_CARE_PROVIDER_SITE_OTHER)

## 2024-09-18 ENCOUNTER — Encounter: Payer: Self-pay | Admitting: Podiatry

## 2024-09-18 VITALS — Ht 67.0 in | Wt 149.0 lb

## 2024-09-18 DIAGNOSIS — M7751 Other enthesopathy of right foot: Secondary | ICD-10-CM | POA: Diagnosis not present

## 2024-09-18 DIAGNOSIS — M205X1 Other deformities of toe(s) (acquired), right foot: Secondary | ICD-10-CM

## 2024-09-18 DIAGNOSIS — M25871 Other specified joint disorders, right ankle and foot: Secondary | ICD-10-CM

## 2024-09-18 DIAGNOSIS — L600 Ingrowing nail: Secondary | ICD-10-CM

## 2024-09-18 MED ORDER — MELOXICAM 15 MG PO TABS: 15.0000 mg | ORAL_TABLET | Freq: Every day | ORAL | 0 refills | Status: AC

## 2024-09-18 NOTE — Patient Instructions (Addendum)
 Hallux Limitus Hallux limitus is a condition involving pain and a loss of motion of the first (big) toe. The pain gets worse with lifting up (extension) of the toe. This is usually due to arthritic bony bumps (spurring) of the joint at the base of the big toe.  SYMPTOMS  Pain, with lifting up of the toe. Tenderness over the joint where the big toe meets the foot. Redness, swelling, and warmth over the top of the base of the big toe (sometimes). Foot pain, stiffness, and limping. CAUSES  Hallux limitus is caused by arthritis of the joint where the big toe meets the foot. The arthritis creates a bone spur that pinches the soft tissues when the toe is extended. RISK INCREASES WITH: Tight shoes with a narrow toe box. Family history of foot problems. Gout and rheumatoid and psoriatic arthritis. History of previous toe injury, including turf toe. Long first toe, flat feet, and other big toe bony bumps. Arthritis of the big toe. PREVENTION  Wear wide-toed shoes that fit well. Tape the big toe to reduce motion and to prevent pinching of the tissues between the bone. Maintain physical fitness: Foot and ankle flexibility. Muscle strength and endurance. PROGNOSIS  This condition can usually be managed with proper treatment. However, surgery is typically required to prevent the problem from recurring.  RELATED COMPLICATIONS  Injury to other areas of the foot or ankle, caused by abnormal walking in an attempt to avoid the pain felt when walking normally. TREATMENT Treatment first involves stopping the activities that aggravate your symptoms. Ice and medicine can be used to reduce the pain and inflammation. Modifications to shoes may help reduce pain, including wearing stiff-soled shoes, shoes with a wide toe box, inserting a padded donut to relieve pressure on top of the joint, or wearing an arch support. Corticosteroid injections may be given to reduce inflammation. If nonsurgical treatment is  unsuccessful, surgery may be needed. Surgical options include removing the arthritic bony spur, cutting a bone in the foot to change the arc of motion (allowing the toe to extend more), or fusion of the joint (eliminating all motion in the joint at the base of the big toe).  MEDICATION  If pain medicine is needed, nonsteroidal anti-inflammatory medicines (aspirin and ibuprofen ), or other minor pain relievers (acetaminophen ), are often advised. Do not take pain medicine for 7 days before surgery. Prescription pain relievers are usually prescribed only after surgery. Use only as directed and only as much as you need. Ointments for arthritis, applied to the skin, may give some relief. Injections of corticosteroids may be given to reduce inflammation. HEAT AND COLD Cold treatment (icing) relieves pain and reduces inflammation. Cold treatment should be applied for 10 to 15 minutes every 2 to 3 hours, and immediately after activity that aggravates your symptoms. Use ice packs or an ice massage. Heat treatment may be used before performing the stretching and strengthening activities prescribed by your caregiver, physical therapist, or athletic trainer. Use a heat pack or a warm water soak. SEEK MEDICAL CARE IF:  Symptoms get worse or do not improve in 2 weeks, despite treatment. After surgery you develop fever, increasing pain, redness, swelling, drainage of fluids, bleeding, or increasing warmth. New, unexplained symptoms develop.    Look for an EvenUp shoe attachment on [employer] or at [employer]. This will level out your hips while you are walking in the CAM boot. Wear this on the other foot around a supportive sneaker:    Shoe recommendations: Specific shoe  models that limit excessive first toe strain include Brooks glycerin  max, Hoka Clifton 9 and ASICS gel nimbus 25

## 2024-09-18 NOTE — Progress Notes (Signed)
 Subjective:  Patient ID: Julia Wolfe, female    DOB: 03-Feb-1981,  MRN: [id number]  Chief Complaint  Patient presents with   Ingrown Toenail    Pt is here to f/u on right great toenail after having ingrown removed, she states the toe is fine and healing well. Still complains of pain to the bottom of the great toe.    Discussed the use of AI scribe software for clinical note transcription with the patient, who gave verbal consent to proceed.  History of Present Illness Julia Wolfe is a 43 year old female who presents with right foot hallux limitus and sesamoiditis.  She is following up after the removal of a right first toe, lateral border, ingrown toenail. The toe is healing well, and she can wear a shoe and work out without issues. She is satisfied with the progress and has no current pain or limitations.  She experiences ongoing issues with her right foot, specifically the first big toe joint. The pain is constant, achy, and exacerbated by walking, persisting after activity. It is localized to the bottom of the foot and is similar to previous pain on the left foot. She tends to walk more on the outside of her foot when the pain starts. She has tried different shoe inserts and padding without much relief.  She has a history of having sesamoids removed from her left foot and now experiences similar symptoms on the right foot. The pain is tender rather than sharp, most noticeable at the end of motion, and localized to the bottom of the foot with no pain on the top.      Objective:    Physical Exam CARDIOVASCULAR: DP and PT pulses palpable. MUSCULOSKELETAL: Tenderness on palpation of right foot sesamoid complex. Muscle strength 5/5 in dorsiflexion, plantar flexion, inversion, eversion. Right foot hallux limitus with 30-40 degrees dorsiflexion without forefoot loading, decreases with simulated weightbearing.  There is some slight crepitus noted with right first MPJ  dorsiflexion towards end range of motion. NEUROLOGICAL: Normal protective and light touch sensation. SKIN: Skin normal for texture and turgor.       Results RADIOLOGY Right foot X-ray: No evidence of fracture; slight elevation of the first metatarsal bone; possible early spur formation on the first metatarsophalangeal joint dorsal first met head; joint space is normal; no signs of arthritis; no cysts in the metatarsal head or base of the proximal phalanx; pes planus foot type noted with negative break in Meary's angle and increased talar head uncoverage   Assessment:   1. Sesamoiditis of right foot   2. Hallux limitus, right   3. Ingrown toenail of right foot      Plan:  Patient was evaluated and treated and all questions answered.  Assessment and Plan Assessment & Plan Right foot sesamoiditis and hallux limitus Chronic sesamoiditis and hallux limitus with pain in the sesamoid complex and limited dorsiflexion of the right great toe. X-rays show no fractures or arthritis, but a slightly elevated first metatarsal and potential early spur formation. Condition exacerbated by stiffness and flat foot structure. Differential includes sesamoid pain and stiffness-related pain. - Prescribed strong ibuprofen  once daily for two weeks. - Advised wearing a walking boot for two weeks, removable for driving. - Provided felt offloading pad for sesamoid pressure reduction. - Recommended icing to reduce inflammation. - Instructed on manual range of motion exercises for the big toe. - Advised supportive shoes with minimal big toe joint flexion, e.g., Burnetta Quick, Asics, New Balance. - Suggested custom  orthotics with sesamoid load reduction cut-out. - Discussed steroid injection option if pain worsens, noting risks of tissue atrophy and bone health issues. - Recommended purchasing a dancer's pad from [employer] for additional offloading.  Right great toe, post-removal ingrown toenail Post-removal of  ingrown toenail healing well with no pain or limitations. Area scabbing, requires minimal intervention. - Advised leaving scab alone, use Q-tip with rubbing alcohol if loose. - No need to cover the area.  Follow-Up Follow-up appointment to assess progress and potentially transition out of the boot. - Schedule follow-up appointment in three weeks.      Return in about 3 weeks (around 10/09/2024) for sesamoiditis .

## 2024-10-10 ENCOUNTER — Ambulatory Visit: Admitting: Podiatry

## 2024-10-10 ENCOUNTER — Encounter: Payer: Self-pay | Admitting: Podiatry

## 2024-10-10 DIAGNOSIS — M25871 Other specified joint disorders, right ankle and foot: Secondary | ICD-10-CM

## 2024-10-10 DIAGNOSIS — M205X1 Other deformities of toe(s) (acquired), right foot: Secondary | ICD-10-CM

## 2024-10-10 NOTE — Progress Notes (Unsigned)
  Subjective:  Patient ID: Julia Wolfe, female    DOB: [birth year],  MRN: [id number]  Chief Complaint  Patient presents with   Foot Pain    R sesamoid sore. Did not like boot it made her ankle hurt.  Not diabetic no anti coag    Discussed the use of AI scribe software for clinical note transcription with the patient, who gave verbal consent to proceed.  History of Present Illness Julia Wolfe is a 43 year old female who presents with persistent right foot pain due to sesamoiditis.  She experiences persistent right foot pain with minor improvement. Activity exacerbates the pain, particularly prolonged standing and exercise. Dancer pads provide some relief, but she finds the boot uncomfortable for her ankle and shin.  She reports minimal use of the cam boot overall.  She takes meloxicam  daily without significant relief and uses ice for inflammation. Her flat feet may contribute to her condition.      Objective:    Physical Exam CARDIOVASCULAR: DP and PT pulses palpable. MUSCULOSKELETAL: Tenderness on palpation of right foot sesamoid complex, tenderness on palpation of the first MPJ joint itself. Muscle strength 5/5 in dorsiflexion, plantar flexion, inversion, eversion. Right foot hallux limitus with 30-40 degrees dorsiflexion without forefoot loading, decreases with simulated weightbearing.  There is some slight crepitus noted with right first MPJ dorsiflexion towards end range of motion. NEUROLOGICAL: Normal protective and light touch sensation. SKIN: Skin normal for texture and turgor.   No images are attached to the encounter.    Results RADIOLOGY Right foot X-ray 09/18/2024: No evidence of fracture; slight elevation of the first metatarsal bone; possible early spur formation on the first metatarsophalangeal joint dorsal first met head; joint space is normal; no signs of arthritis; no cysts in the metatarsal head or base of the proximal phalanx; pes planus foot  type noted with negative break in Meary's angle and increased talar head uncoverage   Assessment:   1. Sesamoiditis of right foot   2. Hallux limitus, right      Plan:  Patient was evaluated and treated and all questions answered.  Assessment and Plan Assessment & Plan Right foot sesamoiditis, right hallux limitus Persistent pain in the right foot sesamoid area with limited relief from current interventions.  Likely aggravated by hallux limitus as well.  Steroid injection deferred due to risk of soft tissue atrophy today per patient.  Can consider steroid injection if no significant proven going forward. - Continue dancer pads with adjusted placement. - Encouraged boot use for immobilization.  Discussed need for limited stance and gait as well and that suspected sesamoid pain will improve with rest - Consider over-the-counter taller boot. - Continue meloxicam  as needed. - Regular icing of the area. - Reassess in 2-3 weeks; consider steroid injection if no improvement. - If no improvement, could consider MRI, possible referral to PT        Return in about 2 weeks (around 10/24/2024) for sesamoiditis.

## 2024-10-30 ENCOUNTER — Ambulatory Visit: Admitting: Podiatry

## 2024-11-21 ENCOUNTER — Ambulatory Visit (INDEPENDENT_AMBULATORY_CARE_PROVIDER_SITE_OTHER): Payer: Self-pay | Admitting: Podiatry

## 2024-11-21 DIAGNOSIS — Z91199 Patient's noncompliance with other medical treatment and regimen due to unspecified reason: Secondary | ICD-10-CM

## 2024-11-21 NOTE — Progress Notes (Signed)
Patient did not show for scheduled appointment today.
